# Patient Record
Sex: Female | Born: 1950 | Race: White | Hispanic: No | Marital: Married | Smoking: Never smoker
Health system: Southern US, Community
[De-identification: ages and names within clinical notes are randomized; demographics above are authoritative.]

## PROBLEM LIST (undated history)

## (undated) DIAGNOSIS — M1990 Unspecified osteoarthritis, unspecified site: Secondary | ICD-10-CM

## (undated) DIAGNOSIS — R51 Headache: Secondary | ICD-10-CM

## (undated) DIAGNOSIS — F419 Anxiety disorder, unspecified: Secondary | ICD-10-CM

## (undated) DIAGNOSIS — C801 Malignant (primary) neoplasm, unspecified: Secondary | ICD-10-CM

## (undated) DIAGNOSIS — Z889 Allergy status to unspecified drugs, medicaments and biological substances status: Secondary | ICD-10-CM

## (undated) DIAGNOSIS — E039 Hypothyroidism, unspecified: Secondary | ICD-10-CM

## (undated) DIAGNOSIS — R519 Headache, unspecified: Secondary | ICD-10-CM

## (undated) DIAGNOSIS — T4145XA Adverse effect of unspecified anesthetic, initial encounter: Secondary | ICD-10-CM

## (undated) HISTORY — PX: CARPAL TUNNEL RELEASE: SHX101

## (undated) HISTORY — PX: TONSILLECTOMY: SUR1361

## (undated) HISTORY — PX: OTHER SURGICAL HISTORY: SHX169

## (undated) HISTORY — PX: APPENDECTOMY: SHX54

## (undated) HISTORY — PX: ABDOMINAL HYSTERECTOMY: SHX81

## (undated) HISTORY — PX: CYSTOCELE REPAIR: SHX163

## (undated) HISTORY — PX: BREAST BIOPSY: SHX20

---

## 1998-08-02 ENCOUNTER — Encounter: Payer: Self-pay | Admitting: Oral Surgery

## 1998-08-02 ENCOUNTER — Ambulatory Visit (HOSPITAL_COMMUNITY): Admission: RE | Admit: 1998-08-02 | Discharge: 1998-08-02 | Payer: Self-pay | Admitting: Oral Surgery

## 1998-12-31 ENCOUNTER — Other Ambulatory Visit: Admission: RE | Admit: 1998-12-31 | Discharge: 1998-12-31 | Payer: Self-pay | Admitting: *Deleted

## 1999-01-11 ENCOUNTER — Encounter: Payer: Self-pay | Admitting: *Deleted

## 1999-01-11 ENCOUNTER — Encounter: Admission: RE | Admit: 1999-01-11 | Discharge: 1999-01-11 | Payer: Self-pay | Admitting: *Deleted

## 2000-02-05 ENCOUNTER — Other Ambulatory Visit: Admission: RE | Admit: 2000-02-05 | Discharge: 2000-02-05 | Payer: Self-pay | Admitting: *Deleted

## 2001-02-05 ENCOUNTER — Other Ambulatory Visit: Admission: RE | Admit: 2001-02-05 | Discharge: 2001-02-05 | Payer: Self-pay | Admitting: Obstetrics and Gynecology

## 2001-12-21 ENCOUNTER — Encounter: Payer: Self-pay | Admitting: Obstetrics and Gynecology

## 2001-12-21 ENCOUNTER — Encounter: Admission: RE | Admit: 2001-12-21 | Discharge: 2001-12-21 | Payer: Self-pay | Admitting: Obstetrics and Gynecology

## 2002-02-07 ENCOUNTER — Other Ambulatory Visit: Admission: RE | Admit: 2002-02-07 | Discharge: 2002-02-07 | Payer: Self-pay | Admitting: Obstetrics and Gynecology

## 2002-07-21 ENCOUNTER — Encounter: Admission: RE | Admit: 2002-07-21 | Discharge: 2002-07-21 | Payer: Self-pay | Admitting: Obstetrics and Gynecology

## 2002-07-21 ENCOUNTER — Encounter: Payer: Self-pay | Admitting: Obstetrics and Gynecology

## 2003-01-25 ENCOUNTER — Encounter: Admission: RE | Admit: 2003-01-25 | Discharge: 2003-01-25 | Payer: Self-pay | Admitting: Obstetrics and Gynecology

## 2003-03-03 ENCOUNTER — Other Ambulatory Visit: Admission: RE | Admit: 2003-03-03 | Discharge: 2003-03-03 | Payer: Self-pay | Admitting: Obstetrics and Gynecology

## 2003-03-18 HISTORY — PX: CHOLECYSTECTOMY: SHX55

## 2003-03-18 HISTORY — PX: OTHER SURGICAL HISTORY: SHX169

## 2003-04-28 ENCOUNTER — Ambulatory Visit (HOSPITAL_COMMUNITY): Admission: RE | Admit: 2003-04-28 | Discharge: 2003-04-29 | Payer: Self-pay | Admitting: General Surgery

## 2003-04-28 ENCOUNTER — Encounter (INDEPENDENT_AMBULATORY_CARE_PROVIDER_SITE_OTHER): Payer: Self-pay | Admitting: *Deleted

## 2003-11-30 ENCOUNTER — Other Ambulatory Visit: Admission: RE | Admit: 2003-11-30 | Discharge: 2003-11-30 | Payer: Self-pay | Admitting: Diagnostic Radiology

## 2004-08-14 ENCOUNTER — Ambulatory Visit (HOSPITAL_COMMUNITY): Admission: RE | Admit: 2004-08-14 | Discharge: 2004-08-14 | Payer: Self-pay | Admitting: Obstetrics and Gynecology

## 2006-09-30 ENCOUNTER — Encounter: Admission: RE | Admit: 2006-09-30 | Discharge: 2006-09-30 | Payer: Self-pay | Admitting: Obstetrics and Gynecology

## 2006-12-14 ENCOUNTER — Encounter: Admission: RE | Admit: 2006-12-14 | Discharge: 2006-12-14 | Payer: Self-pay | Admitting: Sports Medicine

## 2007-06-18 ENCOUNTER — Encounter: Admission: RE | Admit: 2007-06-18 | Discharge: 2007-06-18 | Payer: Self-pay | Admitting: Internal Medicine

## 2007-09-07 ENCOUNTER — Ambulatory Visit (HOSPITAL_BASED_OUTPATIENT_CLINIC_OR_DEPARTMENT_OTHER): Admission: RE | Admit: 2007-09-07 | Discharge: 2007-09-08 | Payer: Self-pay | Admitting: Urology

## 2009-03-06 ENCOUNTER — Encounter: Admission: RE | Admit: 2009-03-06 | Discharge: 2009-03-06 | Payer: Self-pay | Admitting: Obstetrics and Gynecology

## 2009-07-16 ENCOUNTER — Encounter: Admission: RE | Admit: 2009-07-16 | Discharge: 2009-07-16 | Payer: Self-pay | Admitting: Family Medicine

## 2010-04-08 ENCOUNTER — Encounter: Payer: Self-pay | Admitting: Sports Medicine

## 2010-07-30 NOTE — Op Note (Signed)
NAME:  Marie Maynard, Marie Maynard NO.:  [id number]   MEDICAL RECORD NO.:  [id number]          PATIENT TYPE:  AMB   LOCATION:  [facility]                         FACILITY:  [facility]   PHYSICIAN:  Jamison Neighbor, M.D.  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   PROCEDURES:  1. Anterior repair with mesh, including vault suspension.  2. Cystoscopy.   SURGEON:  Jamison Neighbor, M.D.   ANESTHESIA:  General.   COMPLICATIONS:  None.   DRAINS:  None.   BRIEF HISTORY:  This 60 year old female has had a past problem with  stress urinary incontinence, but that was taken care of with a  pubovaginal sling.  This has held up nicely, and the patient does not  appear to have any stress incontinence.  She has had some problems with  incomplete emptying secondary to a large cystocele.  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  She has been on antibiotic  prophylaxis.  When she does stick with that, she does well, but she has  now become symptomatic from her cystocele.  The patient does not have a  significant enterocele.  She has a little bit of laxity posteriorly, but  really not much in the way of a rectocele.  She would like to have the  anterior repair done, and we would like to reinforce that with mesh.  She has requested that a rectocele be done if necessary, but since she  is not having a lot of bowel problems at this point, it is unlikely that  will prove to be necessary.  The patient has agreed to an anterior  repair with mesh.  She does know that if she has significant evidence of  stress incontinence intraoperatively, we will reinforce her sling with  an additional midurethral sling, but right now we would like not to do  that, as we are encouraging  her emptying of the bladder.  The patient  understands the risks and benefits of the procedure, including  infection, erosion, or the possibility that she may have urinary  retention.  Full informed consent was obtained.   PROCEDURE:  After successful induction of general anesthesia, the  patient was placed in the dorsal lithotomy position, prepped with  Betadine, and draped in the usual sterile fashion.  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  There is a cystocele.  There is not much in the way of an  enterocele.  There is at most a modest rectocele.  The weighted vaginal  speculum was placed, and a L[place] Star retractor was used for exposure.  The anterior vaginal mucosa was infiltrated with local anesthetic.  An  incision was made directly over the cystocele, and flaps were raised  bilaterally.  Dissection proceeded back to and through the endopelvic  fascia, exposing the sacrospinalis on each side.  The Capio  device was  used to place the blue arm of the Pinnacle device into the sacrospinalis  on each side.  The Capio was then used to put the white arm of the mesh  graft into the white line.  The limbs were then brought up, and  appropriate tension was obtained, nicely elevating the cystocele.  The  mesh was tacked down to the vaginal cuff, and the additional posterior  aspect was trimmed off.  It was also tacked down up towards the bladder  neck.  The coverage was excellent, and it does appear that the repair  should hold her in a very neutral position.  She was evaluated with  cystoscopy twice, first when the limbs were first placed with 70 mm  instrument to ensure there was no injury to the bladder, then at the end  to make sure there was no overcorrection at the urethra.  The cystocele  did appear to reduce nicely.  The vault went up nicely.  The patient  really did not need any additional sling material, as she  had a  neutrally placed urethra.  With a full bladder and Crede there was nice  flow of urine, without any evidence of obstruction.  The area was  irrigated antibiotic solution.  The mesh was trimmed appropriately and  tacked down, as noted above.  The incision was closed with a running  suture of 2-0 Vicryl.  No vaginal mucosa was trimmed away.  Inspection  at that point showed that there really did not appear to be a  significant rectocele.  While minor laxity was noted, a rectal  examination confirmed that there was really not a significant defect in  the midline, and it was felt that there was no real need for a rectocele  repair.  Inspection showed that the rectum had not been compromised or  injured in any way by the mesh placement.  The gown and glove were  removed, and new gloves placed, and the area was irrigated one final  time with antibiotic solution, and packing was placed.  The patient had  a Foley catheter in place which will be left for 23-hour observation.  The patient will stay in the hospital for antibiotic coverage and pain  management.  She will have her Foley catheter and packing removed and  will be discharged if she voids normally.      Jamison Neighbor, M.D.  Electronically Signed     RJE/MEDQ  D:  09/07/2007  T:  09/07/2007  Job:  [id number]

## 2010-08-02 NOTE — Op Note (Signed)
NAME:  Marie Maynard, Marie Maynard                     ACCOUNT NO.:  [id number]   MEDICAL RECORD NO.:  [id number]                   PATIENT TYPE:  OIB   LOCATION:  2899                                 FACILITY:  [facility]   PHYSICIAN:  Gabrielle Dare. Janee Morn, M.D.             DATE OF BIRTH:  02-Jun-1950   DATE OF PROCEDURE:  04/28/2003  DATE OF DISCHARGE:                                 OPERATIVE REPORT   PREOPERATIVE DIAGNOSIS:  Symptomatic cholelithiasis.   POSTOPERATIVE DIAGNOSIS:  Symptomatic cholelithiasis.   PROCEDURE:  Laparoscopic cholecystectomy.   SURGEON:  Gabrielle Dare. Janee Morn, M.D.   ASSISTANT:  Abigail Miyamoto, M.D.   ANESTHESIA:  General.   HISTORY OF PRESENT ILLNESS:  The patient is a 60 year old white female who  is a patient of Dr. Marcelyn Bruins status post pelvic sling procedure in the  late 1990s.  She was having some abdominal pain.  Workup with Dr. Logan Bores  included an ultrasound looking for renal stones and she was noted to have  cholelithiasis.  She continues to have biliary colic type pain and she  presents now for an elective cholecystectomy.   PROCEDURE IN DETAIL:  Informed consent was obtained.  The patient received  intravenous  antibiotics.  She was taken to the operating room and general  anesthesia was administered.  Her abdomen was prepped and draped in a  sterile fashion.  A curvilinear infraumbilical incision was made.  Subcutaneous tissues were dissected down.  The anterior fascia was divided.  The peritoneal cavity was entered under direct vision.  There were some  filmy omental adhesions which were swept away.  A 0 Vicryl purse-string  suture was placed on the fascial opening.  The Hasson trocar was inserted  into the abdomen.  The abdomen was insufflated with carbon dioxide.  Under  direct vision an 11 mm epigastric and two 5 mm lateral ports were placed.  The dome of the gallbladder was retracted superomedially.  There were  several omental adhesions along the  dome and the body of the gallbladder.  These were swept down without difficulty.  Further adhesions were stuck  around the infundibulum area and these were also cleared off bluntly.  Once  this was accomplished, the infundibulum was retracted inferolaterally.  Dissection began laterally and progressed medially.  The cystic duct was  identified which was quite narrow.  Dissection was continued until a large  window was formed between the infundibulum, cystic duct, and the liver.  This required freeing  of the peritoneal attachments of the medial and  lateral gallbladder wall.  Excellent visualization was obtained.  A clip was  placed on the infundibulum cystic duct junction.  A small nick was made in  the cystic duct.  There was also a large branch of the cystic artery that  ran along the back side of the cystic duct.  This prohibited access for a  cholangiogram that was planned.  This was abandoned due  to inability to  insert the Reddick catheter and the need to get hemostasis of the small  branch.  Three clips were placed proximally on the cystic duct and it was  divided.  Subsequently, two clips were placed on the main portion of the  cystic artery, one clip distally, and this was divided, as well.  The  gallbladder was taken off the liver bed with Bovie cautery.  It was placed  in an endocatch bag and removed from the abdomen via the infraumbilical port  site.  The clips and gallbladder bed were reinspected.  A few scattered  areas on the liver bed were cauterized to get excellent hemostasis.  The  clips remained in good position without any extravasation of blood or bile  from that area.  The abdomen was copiously irrigated.  A total of 2 liters  of saline were used.  This was evacuated and noted to be clear.  The  gallbladder bed was reinspected and remained hemostatic.  The clips remained  in good position.  The ports were then removed under direct vision.  The  pneumoperitoneum was  released.  The Hasson trocar was removed.  The  umbilical port site was closed by tying the 0 Vicryl purse-string suture  that was in the fascia with care not to trap any intra-abdominal contents.  Once that was accomplished, all wounds were copiously irrigated.  0.25%  Marcaine with epinephrine had been used in each one for postoperative pain  relief.  The skin of each was closed with a running 4-0 Vicryl subcuticular  stitch.  Sponge, needle, and instrument counts were correct.  Benzoin, Steri-  Strips, and sterile dressings were applied.  The patient tolerated the  procedure well without apparent complications.  She was taken to the  recovery room in stable condition.                                               Gabrielle Dare Janee Morn, M.D.    BET/MEDQ  D:  04/28/2003  T:  04/28/2003  Job:  [id number]

## 2010-10-25 ENCOUNTER — Other Ambulatory Visit: Payer: Self-pay | Admitting: Obstetrics and Gynecology

## 2010-10-25 DIAGNOSIS — Z1231 Encounter for screening mammogram for malignant neoplasm of breast: Secondary | ICD-10-CM

## 2010-10-30 ENCOUNTER — Ambulatory Visit
Admission: RE | Admit: 2010-10-30 | Discharge: 2010-10-30 | Disposition: A | Discharge status: Home / Self Care | Payer: BC Managed Care – PPO | Source: Ambulatory Visit | Attending: Obstetrics and Gynecology | Admitting: Obstetrics and Gynecology

## 2010-10-30 DIAGNOSIS — Z1231 Encounter for screening mammogram for malignant neoplasm of breast: Secondary | ICD-10-CM

## 2010-12-12 LAB — POCT HEMOGLOBIN-HEMACUE: Operator id: 268271

## 2011-11-18 ENCOUNTER — Other Ambulatory Visit: Payer: Self-pay | Admitting: Obstetrics and Gynecology

## 2011-11-18 DIAGNOSIS — Z1231 Encounter for screening mammogram for malignant neoplasm of breast: Secondary | ICD-10-CM

## 2011-11-18 DIAGNOSIS — Z780 Asymptomatic menopausal state: Secondary | ICD-10-CM

## 2011-11-25 ENCOUNTER — Ambulatory Visit: Payer: BC Managed Care – PPO

## 2011-11-25 ENCOUNTER — Other Ambulatory Visit: Payer: BC Managed Care – PPO

## 2011-12-04 ENCOUNTER — Ambulatory Visit
Admission: RE | Admit: 2011-12-04 | Discharge: 2011-12-04 | Disposition: A | Discharge status: Home / Self Care | Payer: BC Managed Care – PPO | Source: Ambulatory Visit | Attending: Obstetrics and Gynecology | Admitting: Obstetrics and Gynecology

## 2011-12-04 DIAGNOSIS — Z1231 Encounter for screening mammogram for malignant neoplasm of breast: Secondary | ICD-10-CM

## 2011-12-04 DIAGNOSIS — Z780 Asymptomatic menopausal state: Secondary | ICD-10-CM

## 2012-01-19 ENCOUNTER — Other Ambulatory Visit: Payer: Self-pay | Admitting: Family Medicine

## 2012-01-19 DIAGNOSIS — E041 Nontoxic single thyroid nodule: Secondary | ICD-10-CM

## 2012-01-23 ENCOUNTER — Ambulatory Visit
Admission: RE | Admit: 2012-01-23 | Discharge: 2012-01-23 | Disposition: A | Discharge status: Home / Self Care | Payer: BC Managed Care – PPO | Source: Ambulatory Visit | Attending: Family Medicine | Admitting: Family Medicine

## 2012-01-23 DIAGNOSIS — E041 Nontoxic single thyroid nodule: Secondary | ICD-10-CM

## 2012-12-27 ENCOUNTER — Other Ambulatory Visit: Payer: Self-pay

## 2012-12-27 DIAGNOSIS — Z1231 Encounter for screening mammogram for malignant neoplasm of breast: Secondary | ICD-10-CM

## 2013-01-11 ENCOUNTER — Ambulatory Visit
Admission: RE | Admit: 2013-01-11 | Discharge: 2013-01-11 | Disposition: A | Discharge status: Home / Self Care | Payer: BC Managed Care – PPO | Source: Ambulatory Visit

## 2013-01-11 DIAGNOSIS — Z1231 Encounter for screening mammogram for malignant neoplasm of breast: Secondary | ICD-10-CM

## 2014-02-08 ENCOUNTER — Other Ambulatory Visit: Payer: Self-pay

## 2014-02-08 DIAGNOSIS — Z1231 Encounter for screening mammogram for malignant neoplasm of breast: Secondary | ICD-10-CM

## 2014-02-13 ENCOUNTER — Ambulatory Visit
Admission: RE | Admit: 2014-02-13 | Discharge: 2014-02-13 | Disposition: A | Discharge status: Home / Self Care | Payer: BC Managed Care – PPO | Source: Ambulatory Visit

## 2014-02-13 DIAGNOSIS — Z1231 Encounter for screening mammogram for malignant neoplasm of breast: Secondary | ICD-10-CM

## 2014-03-03 ENCOUNTER — Observation Stay (HOSPITAL_BASED_OUTPATIENT_CLINIC_OR_DEPARTMENT_OTHER)
Admission: EM | Admit: 2014-03-03 | Discharge: 2014-03-05 | Disposition: A | Discharge status: Home / Self Care | Payer: BC Managed Care – PPO | Attending: Internal Medicine | Admitting: Internal Medicine

## 2014-03-03 ENCOUNTER — Encounter (HOSPITAL_BASED_OUTPATIENT_CLINIC_OR_DEPARTMENT_OTHER): Payer: Self-pay | Admitting: *Deleted

## 2014-03-03 ENCOUNTER — Emergency Department (HOSPITAL_BASED_OUTPATIENT_CLINIC_OR_DEPARTMENT_OTHER): Payer: BC Managed Care – PPO

## 2014-03-03 DIAGNOSIS — E876 Hypokalemia: Secondary | ICD-10-CM | POA: Diagnosis present

## 2014-03-03 DIAGNOSIS — R42 Dizziness and giddiness: Secondary | ICD-10-CM | POA: Diagnosis not present

## 2014-03-03 DIAGNOSIS — R079 Chest pain, unspecified: Secondary | ICD-10-CM | POA: Diagnosis present

## 2014-03-03 DIAGNOSIS — Q211 Atrial septal defect: Secondary | ICD-10-CM | POA: Insufficient documentation

## 2014-03-03 DIAGNOSIS — R0602 Shortness of breath: Secondary | ICD-10-CM | POA: Diagnosis not present

## 2014-03-03 DIAGNOSIS — Q2112 Patent foramen ovale: Secondary | ICD-10-CM | POA: Insufficient documentation

## 2014-03-03 HISTORY — DX: Hypothyroidism, unspecified: E03.9

## 2014-03-03 LAB — CBC
HEMATOCRIT: 38 % (ref 36.0–46.0)
HEMOGLOBIN: 12.7 g/dL (ref 12.0–15.0)
MCH: 29.7 pg (ref 26.0–34.0)
MCHC: 33.4 g/dL (ref 30.0–36.0)
MCV: 89 fL (ref 78.0–100.0)
Platelets: 191 10*3/uL (ref 150–400)
RBC: 4.27 MIL/uL (ref 3.87–5.11)
RDW: 13.4 % (ref 11.5–15.5)
WBC: 5 10*3/uL (ref 4.0–10.5)

## 2014-03-03 LAB — BASIC METABOLIC PANEL
ANION GAP: 12 (ref 5–15)
BUN: 15 mg/dL (ref 6–23)
CHLORIDE: 103 meq/L (ref 96–112)
CO2: 30 meq/L (ref 19–32)
Calcium: 9.3 mg/dL (ref 8.4–10.5)
Creatinine, Ser: 0.8 mg/dL (ref 0.50–1.10)
GFR calc Af Amer: 89 mL/min — ABNORMAL LOW (ref >90)
GFR calc non Af Amer: 77 mL/min — ABNORMAL LOW (ref >90)
GLUCOSE: 107 mg/dL — AB (ref 70–99)
POTASSIUM: 3.5 meq/L — AB (ref 3.7–5.3)
SODIUM: 145 meq/L (ref 137–147)

## 2014-03-03 LAB — TROPONIN I: Troponin I: <0.3 ng/mL (ref <0.30)

## 2014-03-03 MED ORDER — ENOXAPARIN SODIUM 40 MG/0.4ML ~~LOC~~ SOLN
40.0000 mg | SUBCUTANEOUS | Status: DC
  Filled 2014-03-03 (×2): qty 0.4

## 2014-03-03 MED ORDER — SERTRALINE HCL 100 MG PO TABS
100.0000 mg | ORAL_TABLET | Freq: Every day | ORAL | Status: DC
  Administered 2014-03-04 – 2014-03-05 (×2): 100 mg via ORAL
  Filled 2014-03-03 (×2): qty 1

## 2014-03-03 MED ORDER — ONDANSETRON HCL 4 MG/2ML IJ SOLN
4.0000 mg | Freq: Once | INTRAMUSCULAR | Status: AC
  Administered 2014-03-03: 4 mg via INTRAVENOUS
  Filled 2014-03-03: qty 2

## 2014-03-03 MED ORDER — LEVOTHYROXINE SODIUM 50 MCG PO TABS
50.0000 ug | ORAL_TABLET | Freq: Every day | ORAL | Status: DC
  Administered 2014-03-04 – 2014-03-05 (×2): 50 ug via ORAL
  Filled 2014-03-03 (×2): qty 1

## 2014-03-03 MED ORDER — NITROGLYCERIN 0.4 MG SL SUBL
0.4000 mg | SUBLINGUAL_TABLET | SUBLINGUAL | Status: DC | PRN
  Administered 2014-03-03: 0.4 mg via SUBLINGUAL
  Filled 2014-03-03: qty 1

## 2014-03-03 MED ORDER — MORPHINE SULFATE 2 MG/ML IJ SOLN
2.0000 mg | Freq: Once | INTRAMUSCULAR | Status: AC
  Administered 2014-03-03: 2 mg via INTRAVENOUS
  Filled 2014-03-03: qty 1

## 2014-03-03 MED ORDER — ACETAMINOPHEN 325 MG PO TABS
650.0000 mg | ORAL_TABLET | ORAL | Status: DC | PRN
  Administered 2014-03-04: 650 mg via ORAL
  Filled 2014-03-03: qty 2

## 2014-03-03 MED ORDER — POTASSIUM CHLORIDE CRYS ER 20 MEQ PO TBCR
40.0000 meq | EXTENDED_RELEASE_TABLET | Freq: Once | ORAL | Status: AC
  Administered 2014-03-04: 40 meq via ORAL
  Filled 2014-03-03: qty 2

## 2014-03-03 MED ORDER — ALPRAZOLAM 0.25 MG PO TABS: 0.2500 mg | ORAL_TABLET | Freq: Two times a day (BID) | ORAL | Status: DC | PRN

## 2014-03-03 MED ORDER — MORPHINE SULFATE 2 MG/ML IJ SOLN: 2.0000 mg | INTRAMUSCULAR | Status: DC | PRN

## 2014-03-03 MED ORDER — ONDANSETRON HCL 4 MG/2ML IJ SOLN: 4.0000 mg | Freq: Four times a day (QID) | INTRAMUSCULAR | Status: DC | PRN

## 2014-03-03 NOTE — H&P (Signed)
PCP:  Vidal Schwalbe, MD    Chief Complaint:  Chest pain  HPI: Marie Maynard is a 63 y.o. female   has a past medical history of Hypothyroidism.   Presented with  Patient was shopping started to have chest pain worse with deep breathing and felt lightheaded. The pain felt between the ribs and worse with pressing on the chest.  Patient denies any leg swelling,  no recent trips. Patient has very sedentary job. Currently she is chest pain free. Patient has had a lot of stress recently due to illness of her daughter. While she was having the episode she was dealing with a stressful events as well regarding her shopping. 911 was calleld but patient refused  Transport she arrived by private vehical to [facility].   Hospitalist was called for admission for Chest pain  Review of Systems:    Pertinent positives include:  chest pain,  Dizziness, shortness of breath at rest.   Constitutional:  No weight loss, night sweats, Fevers, chills, fatigue, weight loss  HEENT:  No headaches, Difficulty swallowing,Tooth/dental problems,Sore throat,  No sneezing, itching, ear ache, nasal congestion, post nasal drip,  Cardio-vascular:  No , Orthopnea, PND, anasarca,, palpitations.no Bilateral lower extremity swelling  GI:  No heartburn, indigestion, abdominal pain, nausea, vomiting, diarrhea, change in bowel habits, loss of appetite, melena, blood in stool, hematemesis Resp:  no No dyspnea on exertion, No excess mucus, no productive cough, No non-productive cough, No coughing up of blood.No change in color of mucus.No wheezing. Skin:  no rash or lesions. No jaundice GU:  no dysuria, change in color of urine, no urgency or frequency. No straining to urinate.  No flank pain.  Musculoskeletal:  No joint pain or no joint swelling. No decreased range of motion. No back pain.  Psych:  No change in mood or affect. No depression or anxiety. No memory loss.  Neuro: no localizing neurological complaints,  no tingling, no weakness, no double vision, no gait abnormality, no slurred speech, no confusion  Otherwise ROS are negative except for above, 10 systems were reviewed  Past Medical History: Past Medical History  Diagnosis Date  . Hypothyroidism    History reviewed. No pertinent past surgical history.   Medications: Prior to Admission medications   Medication Sig Start Date End Date Taking? Authorizing Provider  levothyroxine (SYNTHROID, LEVOTHROID) 50 MCG tablet Take 50 mcg by mouth daily before breakfast.   Yes Historical Provider, MD  sertraline (ZOLOFT) 100 MG tablet Take 100 mg by mouth daily.   Yes Historical Provider, MD    Allergies:   Allergies  Allergen Reactions  . Codeine Itching  . Neurontin [Gabapentin] Other (See Comments)    Seeing double  . Oxycodone Itching  . Prednisone Anxiety  . Levaquin [Levofloxacin] Other (See Comments)    Feeling tired    Social History:  Ambulatory  independently    Lives at home  With family     reports that she has never smoked. She does not have any smokeless tobacco history on file. She reports that she does not drink alcohol or use illicit drugs.    Family History: family history includes COPD in her mother; Congenital heart disease in her brother; Pancreatic cancer in her father; Pulmonary Hypertension in her sister.    Physical Exam: Patient Vitals for the past 24 hrs:  BP Temp Temp src Pulse Resp SpO2 Height Weight  03/03/14 2256 (!) 94/43 mmHg 97.9 F (36.6 C) Oral 67 - 95 % 5'  5" (1.651 m) 69.582 kg (153 lb 6.4 oz)  03/03/14 2130 101/58 mmHg - - 60 15 96 % - -  03/03/14 2100 97/59 mmHg - - 60 13 98 % - -  03/03/14 2030 100/60 mmHg - - 63 14 96 % - -  03/03/14 2000 143/92 mmHg - - 67 17 100 % - -  03/03/14 1930 110/58 mmHg - - 69 20 96 % - -  03/03/14 1905 114/60 mmHg - - - 16 100 % - -  03/03/14 1900 108/61 mmHg - - 67 15 100 % - -  03/03/14 1800 101/56 mmHg - - 68 16 92 % - -  03/03/14 1742 - - - 65 17 95  % - -  03/03/14 1737 106/58 mmHg - - 62 18 98 % - -  03/03/14 1736 106/58 mmHg - - 66 19 97 % - -  03/03/14 1730 - - - 69 18 98 % - -  03/03/14 1724 - - - 70 19 96 % - -  03/03/14 1718 - - - 66 25 99 % - -  03/03/14 1712 - - - 73 20 98 % - -  03/03/14 1706 - - - 72 15 100 % - -  03/03/14 1700 - - - 70 13 99 % - -  03/03/14 1654 - - - 67 17 98 % - -  03/03/14 1648 - - - 66 12 97 % - -  03/03/14 1636 - - - 72 21 97 % - -  03/03/14 1630 - - - 73 19 99 % - -  03/03/14 1611 136/78 mmHg 97.9 F (36.6 C) - 73 18 99 % 5\' 5"  (1.651 m) 63.504 kg (140 lb)    1. General:  in No Acute distress 2. Psychological: Alert and Oriented 3. Head/ENT:   Moist   Mucous Membranes                          Head Non traumatic, neck supple                          Normal  Dentition 4. SKIN:  decreased Skin turgor,  Skin clean Dry and intact no rash 5. Heart: Regular rate and rhythm no Murmur, Rub or gallop 6. Lungs: Clear to auscultation bilaterally, no wheezes or crackles   7. Abdomen: Soft, non-tender, Non distended 8. Lower extremities: no clubbing, cyanosis, or edema 9. Neurologically Grossly intact, moving all 4 extremities equally Difficult to palpate pulses on the dorsum of the feet but normal posterior tibialis pulses with feet warm with good capilary refill  10. MSK: Normal range of motion, pain reproducible by plapation  body mass index is 25.53 kg/(m^2).   Labs on Admission:   Results for orders placed or performed during the hospital encounter of 03/03/14 (from the past 24 hour(s))  Basic metabolic panel     Status: Abnormal   Collection Time: 03/03/14  5:00 PM  Result Value Ref Range   Sodium 145 137 - 147 mEq/L   Potassium 3.5 (L) 3.7 - 5.3 mEq/L   Chloride 103 96 - 112 mEq/L   CO2 30 19 - 32 mEq/L   Glucose, Bld 107 (H) 70 - 99 mg/dL   BUN 15 6 - 23 mg/dL   Creatinine, Ser 0.80 0.50 - 1.10 mg/dL   Calcium 9.3 8.4 - 10.5 mg/dL   GFR calc non Af Amer 77 (L) >  90 mL/min   GFR calc Af  Amer 89 (L) >90 mL/min   Anion gap 12 5 - 15  CBC     Status: None   Collection Time: 03/03/14  5:00 PM  Result Value Ref Range   WBC 5.0 4.0 - 10.5 K/uL   RBC 4.27 3.87 - 5.11 MIL/uL   Hemoglobin 12.7 12.0 - 15.0 g/dL   HCT 38.0 36.0 - 46.0 %   MCV 89.0 78.0 - 100.0 fL   MCH 29.7 26.0 - 34.0 pg   MCHC 33.4 30.0 - 36.0 g/dL   RDW 13.4 11.5 - 15.5 %   Platelets 191 150 - 400 K/uL  Troponin I     Status: None   Collection Time: 03/03/14  5:00 PM  Result Value Ref Range   Troponin I <0.30 <0.30 ng/mL    UA not obtained  No results found for: HGBA1C  Estimated Creatinine Clearance: 70.4 mL/min (by C-G formula based on Cr of 0.8).  BNP (last 3 results) No results for input(s): PROBNP in the last 8760 hours.  Other results:  I have pearsonaly reviewed this: ECG REPORT  Rate: 74  Rhythm: NSR ST&T Change: no ischemic changes  Filed Weights   03/03/14 1611 03/03/14 2256  Weight: 63.504 kg (140 lb) 69.582 kg (153 lb 6.4 oz)     Cultures: No results found for: SDES, SPECREQUEST, CULT, REPTSTATUS   Radiological Exams on Admission: Dg Chest 2 View  03/03/2014   CLINICAL DATA:  Left-sided chest pain and near syncope for 1 day  EXAM: CHEST  2 VIEW  COMPARISON:  None.  FINDINGS: Lungs are clear. Heart size and pulmonary vascularity are normal. No adenopathy. There is degenerative change in the thoracic spine. No pneumothorax.  IMPRESSION: No edema or consolidation.   Electronically Signed   By: Lowella Grip M.D.   On: 03/03/2014 16:58    Chart has been reviewed  Assessment/Plan  63 yo F with hx of hypothyrodism and anxiety  her with atypical chest pain worse with inspiration and palpation Present on Admission:  . Chest pain -  will admit, monitor on telemetry, cycle cardiac enzymes, obtain serial ECG. Further risk stratify with lipid panel, hgA1C, obtain TSH. Make sure patient is on Aspirin. Further treatment based on the currently pending results.  Given pleuretic  component will check d.dimer . Hypokalemia - replace  hypothyrodism - continue synthroid,    Prophylaxis: Lovenox, Protonix  CODE STATUS:  FULL CODE   Other plan as per orders.  I have spent a total of 55 min on this admission  Jeanmarie Mccowen 03/03/2014, 11:24 PM  [facility]  Pager [phone]   after 2 AM please page floor coverage PA If 7AM-7PM, please contact the day team taking care of the patient  [email or web address]  Password [email or web address]

## 2014-03-03 NOTE — ED Notes (Signed)
MD at bedside. 

## 2014-03-03 NOTE — ED Notes (Signed)
Chest pain this am. EMS was called and did an EKG that was normal. She refused transport at that time. Pain in her left chest and left shoulder has not gone away. She is also having pain in both of her legs for several weeks.

## 2014-03-03 NOTE — ED Provider Notes (Signed)
CSN: [id number]     Arrival date & time 03/03/14  1608 History   First MD Initiated Contact with Patient 03/03/14 1618     Chief Complaint  Patient presents with  . Chest Pain     (Consider location/radiation/quality/duration/timing/severity/associated sxs/prior Treatment) HPI Comments: PT with hx of diet controlled hyperlipidemia presents with chest pain. She states it started about 12:00 this afternoon while she was shopping. She describes as an achy pain in the left chest. It radiated to her left arm. She had associated shortness of breath and she felt lightheaded like she might pass out with the discomfort. It's eased off quite a bit now but she still has a little bit of discomfort in the ED. It's been constant since it first started. She denies any history of chest pain. She denies any cough or congestion. She denies any tobacco use. There is no family history of early heart disease.  Patient is a 63 y.o. female presenting with chest pain.  Chest Pain Associated symptoms: shortness of breath   Associated symptoms: no abdominal pain, no back pain, no cough, no diaphoresis, no dizziness, no fatigue, no fever, no headache, no nausea, no numbness, not vomiting and no weakness     History reviewed. No pertinent past medical history. History reviewed. No pertinent past surgical history. No family history on file. History  Substance Use Topics  . Smoking status: Never Smoker   . Smokeless tobacco: Not on file  . Alcohol Use: No   OB History    No data available     Review of Systems  Constitutional: Negative for fever, chills, diaphoresis and fatigue.  HENT: Negative for congestion, rhinorrhea and sneezing.   Eyes: Negative.   Respiratory: Positive for shortness of breath. Negative for cough and chest tightness.   Cardiovascular: Positive for chest pain. Negative for leg swelling.  Gastrointestinal: Negative for nausea, vomiting, abdominal pain, diarrhea and blood in stool.   Genitourinary: Negative for frequency, hematuria, flank pain and difficulty urinating.  Musculoskeletal: Negative for back pain and arthralgias.  Skin: Negative for rash.  Neurological: Positive for light-headedness. Negative for dizziness, speech difficulty, weakness, numbness and headaches.      Allergies  Review of patient's allergies indicates not on file.  Home Medications   Prior to Admission medications   Not on File   BP 114/60 mmHg  Pulse 65  Temp(Src) 97.9 F (36.6 C)  Resp 16  Ht 5\' 5"  (1.651 m)  Wt 140 lb (63.504 kg)  BMI 23.30 kg/m2  SpO2 100% Physical Exam  Constitutional: She is oriented to person, place, and time. She appears well-developed and well-nourished.  HENT:  Head: Normocephalic and atraumatic.  Eyes: Pupils are equal, round, and reactive to light.  Neck: Normal range of motion. Neck supple.  Cardiovascular: Normal rate, regular rhythm and normal heart sounds.   Pulmonary/Chest: Effort normal and breath sounds normal. No respiratory distress. She has no wheezes. She has no rales. She exhibits no tenderness.  Abdominal: Soft. Bowel sounds are normal. There is no tenderness. There is no rebound and no guarding.  Musculoskeletal: Normal range of motion. She exhibits no edema.  Lymphadenopathy:    She has no cervical adenopathy.  Neurological: She is alert and oriented to person, place, and time.  Skin: Skin is warm and dry. No rash noted.  Psychiatric: She has a normal mood and affect.    ED Course  Procedures (including critical care time) Labs Review Labs Reviewed  BASIC METABOLIC PANEL -  Abnormal; Notable for the following:    Potassium 3.5 (*)    Glucose, Bld 107 (*)    GFR calc non Af Amer 77 (*)    GFR calc Af Amer 89 (*)    All other components within normal limits  CBC  TROPONIN I    Imaging Review Dg Chest 2 View  03/03/2014   CLINICAL DATA:  Left-sided chest pain and near syncope for 1 day  EXAM: CHEST  2 VIEW  COMPARISON:   None.  FINDINGS: Lungs are clear. Heart size and pulmonary vascularity are normal. No adenopathy. There is degenerative change in the thoracic spine. No pneumothorax.  IMPRESSION: No edema or consolidation.   Electronically Signed   By: Lowella Grip M.D.   On: 03/03/2014 16:58     EKG Interpretation   Date/Time:  Friday March 03 2014 16:23:48 EST Ventricular Rate:  74 PR Interval:  156 QRS Duration: 90 QT Interval:  382 QTC Calculation: 424 R Axis:   75 Text Interpretation:  Normal sinus rhythm Normal ECG since last tracing no  significant change Confirmed by Giavanni Zeitlin  MD, Finlay Godbee ([id number]) on 03/03/2014  4:29:52 PM      MDM   Final diagnoses:  Chest pain, unspecified chest pain type    Patient presents with chest pain. Her EKG does not show ischemic changes. Her troponin is negative. She has a heart score 4. This is moderate risk and I discussed the admitted for further cardiac evaluation. Patient does want to go ahead with this. I will talk to the hospitalist regarding transfer to [facility] for further cardiac evaluation.  Spoke with Dr. Arnoldo Morale who will admit pt.  Malvin Johns, MD 03/03/14 [phone]

## 2014-03-03 NOTE — ED Notes (Signed)
Pt c/o abd pain  Given zofran  md notified

## 2014-03-03 NOTE — ED Notes (Signed)
Assisted on to bedpan.

## 2014-03-04 DIAGNOSIS — R079 Chest pain, unspecified: Secondary | ICD-10-CM | POA: Diagnosis not present

## 2014-03-04 DIAGNOSIS — R42 Dizziness and giddiness: Secondary | ICD-10-CM | POA: Diagnosis not present

## 2014-03-04 DIAGNOSIS — R072 Precordial pain: Secondary | ICD-10-CM

## 2014-03-04 DIAGNOSIS — R0789 Other chest pain: Secondary | ICD-10-CM

## 2014-03-04 DIAGNOSIS — R0602 Shortness of breath: Secondary | ICD-10-CM | POA: Diagnosis not present

## 2014-03-04 LAB — HEMOGLOBIN A1C
HEMOGLOBIN A1C: 5.4 % (ref <5.7)
Mean Plasma Glucose: 108 mg/dL (ref <117)

## 2014-03-04 LAB — D-DIMER, QUANTITATIVE: D-Dimer, Quant: 0.28 ug/mL-FEU (ref 0.00–0.48)

## 2014-03-04 LAB — TROPONIN I: Troponin I: <0.3 ng/mL (ref <0.30)

## 2014-03-04 NOTE — Consult Note (Signed)
CARDIOLOGY CONSULT NOTE   Patient ID: Marie Maynard MRN: [id number], DOB/AGE: Jun 01, 1950   Admit date: 03/03/2014 Date of Consult: 03/04/2014   Primary Physician: Vidal Schwalbe, MD Primary Cardiologist: None  Pt. Profile  63 year old woman without prior history of heart problems admitted with chest pain.  Problem List  Past Medical History  Diagnosis Date  . Hypothyroidism     History reviewed. No pertinent past surgical history.   Allergies  Allergies  Allergen Reactions  . Codeine Itching  . Neurontin [Gabapentin] Other (See Comments)    Seeing double  . Oxycodone Itching  . Prednisone Anxiety  . Levaquin [Levofloxacin] Other (See Comments)    Feeling tired    HPI   This 63 year old woman was admitted for chest pain.  She does not have any prior history of known heart disease.  She has been feeling more stressed during this holiday season.  She still works full-time in [employer].  She has been working especially busy these past 2 months.  Yesterday she was doing some holiday shopping.  At the checkout counter she felt some left-sided chest discomfort worse with taking a deep breath.  She felt a little lightheaded. She contacted her 50 office who suggested that she be checked at the hospital.  This morning she is having no discomfort and feels well.  Her chest x-ray is normal.  Her d-dimer is normal.  Her cardiac enzymes are normal.  Her EKG is normal. The patient does not have any history of diabetes.  She is a nonsmoker.  Her cholesterol levels have run slightly high.  She has a history of hypothyroidism.  Inpatient Medications  . enoxaparin (LOVENOX) injection  40 mg Subcutaneous Q24H  . levothyroxine  50 mcg Oral QAC breakfast  . sertraline  100 mg Oral Daily    Family History Family History  Problem Relation Age of Onset  . COPD Mother   . Pancreatic cancer Father   . Pulmonary Hypertension Sister   . Congenital heart  disease Brother      Social History History   Social History  . Marital Status: Married    Spouse Name: N/A    Number of Children: N/A  . Years of Education: N/A   Occupational History  . Not on file.   Social History Main Topics  . Smoking status: Never Smoker   . Smokeless tobacco: Not on file  . Alcohol Use: No  . Drug Use: No  . Sexual Activity: Not on file   Other Topics Concern  . Not on file   Social History Narrative  . No narrative on file     Review of Systems  General:  No chills, fever, night sweats or weight changes.  Cardiovascular:  No chest pain, dyspnea on exertion, edema, orthopnea, palpitations, paroxysmal nocturnal dyspnea. Dermatological: No rash, lesions/masses Respiratory: No cough, dyspnea Urologic: No hematuria, dysuria Abdominal:   No nausea, vomiting, diarrhea, bright red blood per rectum, melena, or hematemesis Neurologic:  No visual changes, wkns, changes in mental status. All other systems reviewed and are otherwise negative except as noted above.  Physical Exam  Blood pressure 88/52, pulse 60, temperature 97.9 F (36.6 C), temperature source Oral, resp. rate 18, height 5\' 5"  (1.651 m), weight 153 lb 3.1 oz (69.488 kg), SpO2 96 %.  General: Pleasant, NAD Psych: Normal affect. Neuro: Alert and oriented X 3. Moves all extremities spontaneously. HEENT: Normal  Neck: Supple without bruits or JVD. Lungs:  Resp regular and  unlabored, CTA. Heart: RRR no s3, s4, or murmurs. Abdomen: Soft, non-tender, non-distended, BS + x 4.  Extremities: No clubbing, cyanosis or edema. DP/PT/Radials 2+ and equal bilaterally.  Labs   Recent Labs  03/03/14 1700 03/04/14 0250 03/04/14 0547  TROPONINI <0.30 <0.30 <0.30   Lab Results  Component Value Date   WBC 5.0 03/03/2014   HGB 12.7 03/03/2014   HCT 38.0 03/03/2014   MCV 89.0 03/03/2014   PLT 191 03/03/2014     Recent Labs Lab 03/03/14 1700  NA 145  K 3.5*  CL 103  CO2 30  BUN 15    CREATININE 0.80  CALCIUM 9.3  GLUCOSE 107*   No results found for: CHOL, HDL, LDLCALC, TRIG Lab Results  Component Value Date   DDIMER 0.28 03/04/2014    Radiology/Studies  Dg Chest 2 View  03/03/2014   CLINICAL DATA:  Left-sided chest pain and near syncope for 1 day  EXAM: CHEST  2 VIEW  COMPARISON:  None.  FINDINGS: Lungs are clear. Heart size and pulmonary vascularity are normal. No adenopathy. There is degenerative change in the thoracic spine. No pneumothorax.  IMPRESSION: No edema or consolidation.   Electronically Signed   By: Lowella Grip M.D.   On: 03/03/2014 16:58   Mm Digital Screening Bilateral  02/14/2014   CLINICAL DATA:  Screening.  EXAM: DIGITAL SCREENING BILATERAL MAMMOGRAM WITH CAD  COMPARISON:  Previous exam(s).  ACR Breast Density Category b: There are scattered areas of fibroglandular density.  FINDINGS: There are no findings suspicious for malignancy. Images were processed with CAD.  IMPRESSION: No mammographic evidence of malignancy. A result letter of this screening mammogram will be mailed directly to the patient.  RECOMMENDATION: Screening mammogram in one year. (Code:SM-B-01Y)  BI-RADS CATEGORY  1: Negative.   Electronically Signed   By: Marin Olp M.D.   On: 02/14/2014 12:44    ECG  Normal sinus rhythm.  Within normal limits.  Personally reviewed.  ASSESSMENT AND PLAN  1.  Atypical chest pain.  No evidence of myocardial ischemia.  EKG and cardiac enzymes are normal.  I suspect that this may have been secondary to stress. 2.  Borderline hypokalemia 3.  Hypothyroidism  Recommendation: A two-dimensional echocardiogram is pending.  If the echo is unremarkable she should be able to be discharged later today.  Follow-up with Dr. Harlan Stains.  If she has further chest discomfort problems we would be glad to see her in the office for outpatient workup and stress test etc.    Signed, Darlin Coco, MD  03/04/2014, 9:22 AM

## 2014-03-04 NOTE — Discharge Summary (Addendum)
Physician Discharge Summary  Marie Maynard IWL:[id number] DOB: 01-01-1951 DOA: 03/03/2014  PCP: Vidal Schwalbe, MD  Admit date: 03/03/2014 Discharge date: 03/04/2014  Time spent: 50 minutes  Recommendations for Outpatient Follow-up:  1. F/u with Dr Mare Ferrari if chest pain recurs   Discharge Condition: stable Diet recommendation: heart healthy  Discharge Diagnoses:  Principal Problem:   Chest pain Active Problems:   Hypokalemia  hypothyroidism  History of present illness:  Marie Maynard is a 63 y.o. female who has a past medical history of Hypothyroidism.  Patient was shopping started to have chest pain worse with deep breathing and felt lightheaded. The pain was felt between the ribs and worse with pressing on the chest.  Patient denies any leg swelling, no recent trips. Patient has very sedentary job. Currently she is chest pain free. Patient has had a lot of stress recently due to illness of her daughter. While she was having the episode she was dealing with a stressful events as well regarding her shopping. 911 was calleld but patient refused Transport she arrived by private vehical to [facility].   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 - Dr Irish Lack and I have discussed the above finding of PFO- no further work up recommended- complications of CVA from DVT explained- TEDS ordered  Hypokalemia -Replaced  Hypothyroidism -Continue levothyroxine  Procedures:  2-D echo  Consultations:  Cardiology  Discharge Exam: Filed Weights   03/03/14 1611 03/03/14 2256 03/04/14 0444  Weight: 63.504 kg (140 lb) 69.582 kg (153 lb 6.4 oz) 69.488 kg (153 lb 3.1 oz)   Filed  Vitals:   03/04/14 1408  BP: 111/49  Pulse: 62  Temp: 97.7 F (36.5 C)  Resp: 20    General: AAO x 3, no distress Cardiovascular: RRR, no murmurs  Respiratory: clear to auscultation bilaterally GI: soft, non-tender, non-distended, bowel sound positive  Discharge Instructions You were cared for by a hospitalist during your hospital stay. If you have any questions about your discharge medications or the care you received while you were in the hospital after you are discharged, you can call the unit and asked to speak with the hospitalist on call if the hospitalist that took care of you is not available. Once you are discharged, your primary care physician will handle any further medical issues. Please note that NO REFILLS for any discharge medications will be authorized once you are discharged, as it is imperative that you return to your primary care physician (or establish a relationship with a primary care physician if you do not have one) for your aftercare needs so that they can reassess your need for medications and monitor your lab values.     Medication List    ASK your doctor about these medications        levothyroxine 50 MCG tablet  Commonly known as:  SYNTHROID, LEVOTHROID  Take 50 mcg by mouth daily before breakfast.     multivitamin with minerals Tabs tablet  Take 1 tablet by mouth daily.     sertraline 100 MG tablet  Commonly known as:  ZOLOFT  Take 100 mg by mouth daily.     zolpidem 12.5 MG CR tablet  Commonly known as:  AMBIEN CR  Take 12.5 mg by mouth at bedtime as needed for sleep.  Allergies  Allergen Reactions  . Codeine Itching  . Neurontin [Gabapentin] Other (See Comments)    Seeing double  . Oxycodone Itching  . Prednisone Anxiety  . Levaquin [Levofloxacin] Other (See Comments)    Feeling tired      The results of significant diagnostics from this hospitalization (including imaging, microbiology, ancillary and laboratory) are listed  below for reference.    Significant Diagnostic Studies: Dg Chest 2 View  03/03/2014   CLINICAL DATA:  Left-sided chest pain and near syncope for 1 day  EXAM: CHEST  2 VIEW  COMPARISON:  None.  FINDINGS: Lungs are clear. Heart size and pulmonary vascularity are normal. No adenopathy. There is degenerative change in the thoracic spine. No pneumothorax.  IMPRESSION: No edema or consolidation.   Electronically Signed   By: Lowella Grip M.D.   On: 03/03/2014 16:58   Mm Digital Screening Bilateral  02/14/2014   CLINICAL DATA:  Screening.  EXAM: DIGITAL SCREENING BILATERAL MAMMOGRAM WITH CAD  COMPARISON:  Previous exam(s).  ACR Breast Density Category b: There are scattered areas of fibroglandular density.  FINDINGS: There are no findings suspicious for malignancy. Images were processed with CAD.  IMPRESSION: No mammographic evidence of malignancy. A result letter of this screening mammogram will be mailed directly to the patient.  RECOMMENDATION: Screening mammogram in one year. (Code:SM-B-01Y)  BI-RADS CATEGORY  1: Negative.   Electronically Signed   By: Marin Olp M.D.   On: 02/14/2014 12:44    Microbiology: No results found for this or any previous visit (from the past 240 hour(s)).   Labs: Basic Metabolic Panel:  Recent Labs Lab 03/03/14 1700  NA 145  K 3.5*  CL 103  CO2 30  GLUCOSE 107*  BUN 15  CREATININE 0.80  CALCIUM 9.3   Liver Function Tests: No results for input(s): AST, ALT, ALKPHOS, BILITOT, PROT, ALBUMIN in the last 168 hours. No results for input(s): LIPASE, AMYLASE in the last 168 hours. No results for input(s): AMMONIA in the last 168 hours. CBC:  Recent Labs Lab 03/03/14 1700  WBC 5.0  HGB 12.7  HCT 38.0  MCV 89.0  PLT 191   Cardiac Enzymes:  Recent Labs Lab 03/03/14 1700 03/04/14 0250 03/04/14 0547  TROPONINI <0.30 <0.30 <0.30   BNP: BNP (last 3 results) No results for input(s): PROBNP in the last 8760 hours. CBG: No results for input(s):  GLUCAP in the last 168 hours.     SignedDebbe Odea, MD [facility] 03/04/2014, 4:26 PM

## 2014-03-04 NOTE — Progress Notes (Signed)
  Echocardiogram 2D Echocardiogram has been performed.  Marie Maynard 03/04/2014, 12:30 PM

## 2014-03-05 DIAGNOSIS — Q2112 Patent foramen ovale: Secondary | ICD-10-CM | POA: Insufficient documentation

## 2014-03-05 DIAGNOSIS — Q211 Atrial septal defect: Secondary | ICD-10-CM

## 2014-03-05 LAB — BASIC METABOLIC PANEL
Anion gap: 13 (ref 5–15)
BUN: 11 mg/dL (ref 6–23)
CHLORIDE: 104 meq/L (ref 96–112)
CO2: 25 meq/L (ref 19–32)
Calcium: 9 mg/dL (ref 8.4–10.5)
Creatinine, Ser: 0.64 mg/dL (ref 0.50–1.10)
GFR calc Af Amer: >90 mL/min (ref >90)
GFR calc non Af Amer: >90 mL/min (ref >90)
Glucose, Bld: 93 mg/dL (ref 70–99)
Potassium: 3.8 mEq/L (ref 3.7–5.3)
Sodium: 142 mEq/L (ref 137–147)

## 2014-03-05 LAB — LIPID PANEL
CHOL/HDL RATIO: 4.7 ratio
Cholesterol: 207 mg/dL — ABNORMAL HIGH (ref 0–200)
HDL: 44 mg/dL (ref >39)
LDL Cholesterol: 143 mg/dL — ABNORMAL HIGH (ref 0–99)
Triglycerides: 101 mg/dL (ref <150)
VLDL: 20 mg/dL (ref 0–40)

## 2014-03-05 MED ORDER — ENOXAPARIN SODIUM 40 MG/0.4ML ~~LOC~~ SOLN: 40.0000 mg | SUBCUTANEOUS | Status: DC

## 2014-03-05 MED ORDER — T.E.D. BELOW KNEE/L-REGULAR MISC: 1.0000 | Freq: Every day | Status: DC

## 2014-03-05 MED ORDER — T.E.D. THIGH LENGTH/L-REGULAR MISC: 1.0000 | Freq: Every day | Status: DC

## 2014-03-05 NOTE — Progress Notes (Signed)
Utilization Review Completed.   Dorianna Mckiver, RN, BSN Nurse Case Manager  

## 2014-03-05 NOTE — Progress Notes (Signed)
[facility]  Patient examined. ECHO and plans for discharge discussed. Please see d/c my summary from 03/04/14 which I have updated today.   Debbe Odea, MD

## 2014-03-05 NOTE — Progress Notes (Addendum)
SUBJECTIVE:  No further chest pain  OBJECTIVE:   Vitals:   Filed Vitals:   03/04/14 0444 03/04/14 1408 03/04/14 2243 03/05/14 0614  BP: 88/52 111/49 100/58 97/58  Pulse: 60 62 65 71  Temp: 97.9 F (36.6 C) 97.7 F (36.5 C) 99 F (37.2 C) 98.3 F (36.8 C)  TempSrc: Oral Oral Oral Oral  Resp: 18 20 20 20   Height:      Weight: 153 lb 3.1 oz (69.488 kg)   149 lb (67.586 kg)  SpO2: 96% 98% 95% 95%   I&O's:   Intake/Output Summary (Last 24 hours) at 03/05/14 1245 Last data filed at 03/05/14 0900  Gross per 24 hour  Intake   1080 ml  Output   1100 ml  Net    -20 ml   TELEMETRY: Reviewed telemetry pt in normal sinus rhythm:     PHYSICAL EXAM General: Well developed, well nourished, in no acute distress Head:   Normal cephalic and atramatic  Lungs:   Clear bilaterally to auscultation. Heart:   HRRR S1 S2  No JVD.   Abdomen: abdomen soft and non-tender Msk:  Back normal,  Normal strength and tone for age. Extremities:   No edema.   Neuro: Alert and oriented. Psych:  Normal affect, responds appropriately Skin: No rash   LABS: Basic Metabolic Panel:  Recent Labs  03/03/14 1700 03/05/14 0341  NA 145 142  K 3.5* 3.8  CL 103 104  CO2 30 25  GLUCOSE 107* 93  BUN 15 11  CREATININE 0.80 0.64  CALCIUM 9.3 9.0   Liver Function Tests: No results for input(s): AST, ALT, ALKPHOS, BILITOT, PROT, ALBUMIN in the last 72 hours. No results for input(s): LIPASE, AMYLASE in the last 72 hours. CBC:  Recent Labs  03/03/14 1700  WBC 5.0  HGB 12.7  HCT 38.0  MCV 89.0  PLT 191   Cardiac Enzymes:  Recent Labs  03/03/14 1700 03/04/14 0250 03/04/14 0547  TROPONINI <0.30 <0.30 <0.30   BNP: Invalid input(s): POCBNP D-Dimer:  Recent Labs  03/04/14 0256  DDIMER 0.28   Hemoglobin A1C:  Recent Labs  03/04/14 0250  HGBA1C 5.4   Fasting Lipid Panel:  Recent Labs  03/05/14 0341  CHOL 207*  HDL 44  LDLCALC 143*  TRIG 101  CHOLHDL 4.7   Thyroid  Function Tests: No results for input(s): TSH, T4TOTAL, T3FREE, THYROIDAB in the last 72 hours.  Invalid input(s): FREET3 Anemia Panel: No results for input(s): VITAMINB12, FOLATE, FERRITIN, TIBC, IRON, RETICCTPCT in the last 72 hours. Coag Panel:   No results found for: INR, PROTIME  RADIOLOGY: Dg Chest 2 View  03/03/2014   CLINICAL DATA:  Left-sided chest pain and near syncope for 1 day  EXAM: CHEST  2 VIEW  COMPARISON:  None.  FINDINGS: Lungs are clear. Heart size and pulmonary vascularity are normal. No adenopathy. There is degenerative change in the thoracic spine. No pneumothorax.  IMPRESSION: No edema or consolidation.   Electronically Signed   By: Lowella Grip M.D.   On: 03/03/2014 16:58   Mm Digital Screening Bilateral  02/14/2014   CLINICAL DATA:  Screening.  EXAM: DIGITAL SCREENING BILATERAL MAMMOGRAM WITH CAD  COMPARISON:  Previous exam(s).  ACR Breast Density Category b: There are scattered areas of fibroglandular density.  FINDINGS: There are no findings suspicious for malignancy. Images were processed with CAD.  IMPRESSION: No mammographic evidence of malignancy. A result letter of this screening mammogram will be mailed directly to the patient.  RECOMMENDATION: Screening mammogram in one year. (Code:SM-B-01Y)  BI-RADS CATEGORY  1: Negative.   Electronically Signed   By: Marin Olp M.D.   On: 02/14/2014 12:44      ASSESSMENT: Marie Maynard:  She feels more relaxed today. Her symptoms are gone. Echocardiogram showed normal left jugular function. There was mention of a patent foramen ovale. We went over the significance of this abnormality. I don't think it is responsible for her symptoms. Continue medical therapy. If she has further symptoms, she can follow-up with Dr. Mare Ferrari and have an outpatient stress test. Okay to discharge from a cardiac standpoint.  Continue with risk factor modification and preventive therapy.  Jettie Booze, MD  03/05/2014  12:45 PM

## 2014-06-29 ENCOUNTER — Other Ambulatory Visit: Payer: Self-pay | Admitting: Orthopedic Surgery

## 2014-06-29 DIAGNOSIS — M25512 Pain in left shoulder: Secondary | ICD-10-CM

## 2014-07-17 ENCOUNTER — Other Ambulatory Visit: Payer: Self-pay | Admitting: Orthopaedic Surgery

## 2014-07-17 DIAGNOSIS — M4316 Spondylolisthesis, lumbar region: Secondary | ICD-10-CM

## 2014-07-21 ENCOUNTER — Other Ambulatory Visit: Payer: BC Managed Care – PPO

## 2014-07-30 ENCOUNTER — Ambulatory Visit
Admission: RE | Admit: 2014-07-30 | Discharge: 2014-07-30 | Disposition: A | Discharge status: Home / Self Care | Payer: BC Managed Care – PPO | Source: Ambulatory Visit | Attending: Orthopaedic Surgery | Admitting: Orthopaedic Surgery

## 2014-07-30 ENCOUNTER — Ambulatory Visit
Admission: RE | Admit: 2014-07-30 | Discharge: 2014-07-30 | Disposition: A | Discharge status: Home / Self Care | Payer: BC Managed Care – PPO | Source: Ambulatory Visit | Attending: Orthopedic Surgery | Admitting: Orthopedic Surgery

## 2014-07-30 DIAGNOSIS — M4316 Spondylolisthesis, lumbar region: Secondary | ICD-10-CM

## 2014-07-30 DIAGNOSIS — M25512 Pain in left shoulder: Secondary | ICD-10-CM

## 2014-08-09 ENCOUNTER — Telehealth: Payer: Self-pay | Admitting: Interventional Cardiology

## 2014-08-09 NOTE — Telephone Encounter (Signed)
Received records from [facility] forwarded to [facility] via fax 5/25/16fbg.

## 2015-09-03 ENCOUNTER — Other Ambulatory Visit: Payer: Self-pay | Admitting: Orthopedic Surgery

## 2015-09-03 DIAGNOSIS — M1711 Unilateral primary osteoarthritis, right knee: Secondary | ICD-10-CM

## 2015-09-03 DIAGNOSIS — M25561 Pain in right knee: Secondary | ICD-10-CM

## 2015-09-09 ENCOUNTER — Ambulatory Visit
Admission: RE | Admit: 2015-09-09 | Discharge: 2015-09-09 | Disposition: A | Discharge status: Home / Self Care | Payer: Medicare Other | Source: Ambulatory Visit | Attending: Orthopedic Surgery | Admitting: Orthopedic Surgery

## 2015-09-09 DIAGNOSIS — M25561 Pain in right knee: Secondary | ICD-10-CM

## 2015-09-09 DIAGNOSIS — M1711 Unilateral primary osteoarthritis, right knee: Secondary | ICD-10-CM

## 2015-10-08 ENCOUNTER — Ambulatory Visit: Payer: Self-pay | Admitting: Orthopedic Surgery

## 2015-10-22 ENCOUNTER — Encounter (HOSPITAL_COMMUNITY): Payer: Self-pay

## 2015-10-22 NOTE — Patient Instructions (Addendum)
TIENA HIRES  10/22/2015   Your procedure is scheduled on: 10/31/2015    Report to [facility]  Entrance take [place]  elevators to 3rd floor to  [place] at    [place] AM.  Call this number if you have problems the morning of surgery [phone]   Remember: ONLY 1 PERSON MAY GO WITH YOU TO SHORT STAY TO GET  READY MORNING OF YOUR SURGERY.  Do not eat food or drink liquids :After Midnight.     Take these medicines the morning of surgery with A SIP OF WATER: Synthroid, Nasonex if needed,  zoloft                                 You may not have any metal on your body including hair pins and              piercings  Do not wear jewelry, make-up, lotions, powders or perfumes, deodorant             Do not wear nail polish.  Do not shave  48 hours prior to surgery.                 Do not bring valuables to the hospital. [place].  Contacts, dentures or bridgework may not be worn into surgery.      Patients discharged the day of surgery will not be allowed to drive home.  Name and phone number of your driver:  Special Instructions: coughing and deep breathing exercises, leg exercises               Please read over the following fact sheets you were given: _____________________________________________________________________             [facility] - Preparing for Surgery Before surgery, you can play an important role.  Because skin is not sterile, your skin needs to be as free of germs as possible.  You can reduce the number of germs on your skin by washing with CHG (chlorahexidine gluconate) soap before surgery.  CHG is an antiseptic cleaner which kills germs and bonds with the skin to continue killing germs even after washing. Please DO NOT use if you have an allergy to CHG or antibacterial soaps.  If your skin becomes reddened/irritated stop using the CHG and inform your nurse when you arrive at  Short Stay. Do not shave (including legs and underarms) for at least 48 hours prior to the first CHG shower.  You may shave your face/neck. Please follow these instructions carefully:  1.  Shower with CHG Soap the night before surgery and the  morning of Surgery.  2.  If you choose to wash your hair, wash your hair first as usual with your  normal  shampoo.  3.  After you shampoo, rinse your hair and body thoroughly to remove the  shampoo.                           4.  Use CHG as you would any other liquid soap.  You can apply chg directly  to the skin and wash  Gently with a scrungie or clean washcloth.  5.  Apply the CHG Soap to your body ONLY FROM THE NECK DOWN.   Do not use on face/ open                           Wound or open sores. Avoid contact with eyes, ears mouth and genitals (private parts).                       Wash face,  Genitals (private parts) with your normal soap.             6.  Wash thoroughly, paying special attention to the area where your surgery  will be performed.  7.  Thoroughly rinse your body with warm water from the neck down.  8.  DO NOT shower/wash with your normal soap after using and rinsing off  the CHG Soap.                9.  Pat yourself dry with a clean towel.            10.  Wear clean pajamas.            11.  Place clean sheets on your bed the night of your first shower and do not  sleep with pets. Day of Surgery : Do not apply any lotions/deodorants the morning of surgery.  Please wear clean clothes to the hospital/surgery center.  FAILURE TO FOLLOW THESE INSTRUCTIONS MAY RESULT IN THE CANCELLATION OF YOUR SURGERY PATIENT SIGNATURE_________________________________  NURSE SIGNATURE__________________________________  ________________________________________________________________________

## 2015-10-24 ENCOUNTER — Encounter (INDEPENDENT_AMBULATORY_CARE_PROVIDER_SITE_OTHER): Payer: Self-pay

## 2015-10-24 ENCOUNTER — Encounter (HOSPITAL_COMMUNITY)
Admission: RE | Admit: 2015-10-24 | Discharge: 2015-10-24 | Disposition: A | Discharge status: Home / Self Care | Payer: Medicare Other | Source: Ambulatory Visit | Attending: Orthopedic Surgery | Admitting: Orthopedic Surgery

## 2015-10-24 ENCOUNTER — Encounter (HOSPITAL_COMMUNITY): Payer: Self-pay

## 2015-10-24 DIAGNOSIS — Z01812 Encounter for preprocedural laboratory examination: Secondary | ICD-10-CM | POA: Insufficient documentation

## 2015-10-24 HISTORY — DX: Malignant (primary) neoplasm, unspecified: C80.1

## 2015-10-24 LAB — SURGICAL PCR SCREEN
MRSA, PCR: NEGATIVE
Staphylococcus aureus: POSITIVE — AB

## 2015-10-24 LAB — CBC
HCT: 42.6 % (ref 36.0–46.0)
HEMOGLOBIN: 14 g/dL (ref 12.0–15.0)
MCH: 28.7 pg (ref 26.0–34.0)
MCHC: 32.9 g/dL (ref 30.0–36.0)
MCV: 87.5 fL (ref 78.0–100.0)
PLATELETS: 180 10*3/uL (ref 150–400)
RBC: 4.87 MIL/uL (ref 3.87–5.11)
RDW: 13.3 % (ref 11.5–15.5)
WBC: 5.1 10*3/uL (ref 4.0–10.5)

## 2015-10-31 ENCOUNTER — Encounter (HOSPITAL_COMMUNITY)
Admission: RE | Disposition: A | Discharge status: Home / Self Care | Payer: Self-pay | Source: Ambulatory Visit | Attending: Orthopedic Surgery

## 2015-10-31 ENCOUNTER — Ambulatory Visit (HOSPITAL_COMMUNITY): Payer: Medicare Other | Admitting: Certified Registered Nurse Anesthetist

## 2015-10-31 ENCOUNTER — Encounter (HOSPITAL_COMMUNITY): Payer: Self-pay | Admitting: Orthopedic Surgery

## 2015-10-31 ENCOUNTER — Ambulatory Visit (HOSPITAL_COMMUNITY)
Admission: RE | Admit: 2015-10-31 | Discharge: 2015-10-31 | Disposition: A | Discharge status: Home / Self Care | Payer: Medicare Other | Source: Ambulatory Visit | Attending: Orthopedic Surgery | Admitting: Orthopedic Surgery

## 2015-10-31 DIAGNOSIS — M25561 Pain in right knee: Secondary | ICD-10-CM | POA: Diagnosis present

## 2015-10-31 DIAGNOSIS — M94261 Chondromalacia, right knee: Secondary | ICD-10-CM | POA: Diagnosis not present

## 2015-10-31 DIAGNOSIS — S83289A Other tear of lateral meniscus, current injury, unspecified knee, initial encounter: Secondary | ICD-10-CM | POA: Diagnosis present

## 2015-10-31 DIAGNOSIS — X58XXXA Exposure to other specified factors, initial encounter: Secondary | ICD-10-CM | POA: Insufficient documentation

## 2015-10-31 DIAGNOSIS — S83281A Other tear of lateral meniscus, current injury, right knee, initial encounter: Secondary | ICD-10-CM | POA: Diagnosis not present

## 2015-10-31 DIAGNOSIS — E039 Hypothyroidism, unspecified: Secondary | ICD-10-CM | POA: Insufficient documentation

## 2015-10-31 DIAGNOSIS — T8859XA Other complications of anesthesia, initial encounter: Secondary | ICD-10-CM

## 2015-10-31 DIAGNOSIS — Z79899 Other long term (current) drug therapy: Secondary | ICD-10-CM | POA: Diagnosis not present

## 2015-10-31 HISTORY — PX: KNEE ARTHROSCOPY WITH LATERAL MENISECTOMY: SHX6193

## 2015-10-31 HISTORY — DX: Other complications of anesthesia, initial encounter: T88.59XA

## 2015-10-31 LAB — POCT I-STAT 4, (NA,K, GLUC, HGB,HCT)
Glucose, Bld: 85 mg/dL (ref 65–99)
HCT: 39 % (ref 36.0–46.0)
Hemoglobin: 13.3 g/dL (ref 12.0–15.0)
Potassium: 4 mmol/L (ref 3.5–5.1)
Sodium: 144 mmol/L (ref 135–145)

## 2015-10-31 SURGERY — ARTHROSCOPY, KNEE, WITH LATERAL MENISCECTOMY
Anesthesia: General | Site: Knee | Laterality: Right

## 2015-10-31 MED ORDER — LIDOCAINE HCL (CARDIAC) 20 MG/ML IV SOLN
INTRAVENOUS | Status: DC | PRN
  Administered 2015-10-31: 100 mg via INTRAVENOUS

## 2015-10-31 MED ORDER — HYDROMORPHONE HCL 1 MG/ML IJ SOLN
INTRAMUSCULAR | Status: AC
  Filled 2015-10-31: qty 1

## 2015-10-31 MED ORDER — HYDROMORPHONE HCL 2 MG PO TABS: 2.0000 mg | ORAL_TABLET | Freq: Once | ORAL | Status: DC

## 2015-10-31 MED ORDER — PROPOFOL 10 MG/ML IV BOLUS
INTRAVENOUS | Status: DC | PRN
  Administered 2015-10-31: 130 mg via INTRAVENOUS

## 2015-10-31 MED ORDER — BUPIVACAINE-EPINEPHRINE (PF) 0.25% -1:200000 IJ SOLN
INTRAMUSCULAR | Status: AC
  Filled 2015-10-31: qty 30

## 2015-10-31 MED ORDER — FENTANYL CITRATE (PF) 100 MCG/2ML IJ SOLN
INTRAMUSCULAR | Status: DC | PRN
  Administered 2015-10-31: 25 ug via INTRAVENOUS
  Administered 2015-10-31 (×2): 50 ug via INTRAVENOUS

## 2015-10-31 MED ORDER — BUPIVACAINE-EPINEPHRINE 0.25% -1:200000 IJ SOLN
INTRAMUSCULAR | Status: DC | PRN
  Administered 2015-10-31: 20 mL

## 2015-10-31 MED ORDER — ACETAMINOPHEN 10 MG/ML IV SOLN
1000.0000 mg | Freq: Once | INTRAVENOUS | Status: AC
  Administered 2015-10-31: 1000 mg via INTRAVENOUS

## 2015-10-31 MED ORDER — LIDOCAINE HCL (CARDIAC) 20 MG/ML IV SOLN
INTRAVENOUS | Status: AC
  Filled 2015-10-31: qty 5

## 2015-10-31 MED ORDER — ONDANSETRON HCL 4 MG/2ML IJ SOLN
INTRAMUSCULAR | Status: DC | PRN
  Administered 2015-10-31: 4 mg via INTRAVENOUS

## 2015-10-31 MED ORDER — PROMETHAZINE HCL 25 MG/ML IJ SOLN
6.2500 mg | INTRAMUSCULAR | Status: AC | PRN
  Administered 2015-10-31 (×2): 6.25 mg via INTRAVENOUS

## 2015-10-31 MED ORDER — CEFAZOLIN SODIUM-DEXTROSE 2-4 GM/100ML-% IV SOLN
INTRAVENOUS | Status: AC
  Filled 2015-10-31: qty 100

## 2015-10-31 MED ORDER — SODIUM CHLORIDE 0.9 % IJ SOLN
INTRAMUSCULAR | Status: AC
  Filled 2015-10-31: qty 10

## 2015-10-31 MED ORDER — HYDROMORPHONE HCL 1 MG/ML IJ SOLN
0.2500 mg | INTRAMUSCULAR | Status: DC | PRN
  Administered 2015-10-31 (×2): 0.5 mg via INTRAVENOUS

## 2015-10-31 MED ORDER — ACETAMINOPHEN 10 MG/ML IV SOLN
INTRAVENOUS | Status: AC
  Filled 2015-10-31: qty 100

## 2015-10-31 MED ORDER — ONDANSETRON HCL 4 MG/2ML IJ SOLN: 4.0000 mg | Freq: Four times a day (QID) | INTRAMUSCULAR | Status: DC | PRN

## 2015-10-31 MED ORDER — HYDROMORPHONE HCL 2 MG PO TABS
2.0000 mg | ORAL_TABLET | Freq: Once | ORAL | Status: DC
Start: 2015-10-31 — End: 2015-10-31

## 2015-10-31 MED ORDER — HYDROMORPHONE HCL 2 MG PO TABS: 2.0000 mg | ORAL_TABLET | ORAL | 0 refills | Status: DC | PRN

## 2015-10-31 MED ORDER — FENTANYL CITRATE (PF) 100 MCG/2ML IJ SOLN
INTRAMUSCULAR | Status: AC
  Filled 2015-10-31: qty 2

## 2015-10-31 MED ORDER — PROPOFOL 10 MG/ML IV BOLUS
INTRAVENOUS | Status: AC
  Filled 2015-10-31: qty 40

## 2015-10-31 MED ORDER — EPHEDRINE SULFATE 50 MG/ML IJ SOLN
INTRAMUSCULAR | Status: AC
  Filled 2015-10-31: qty 1

## 2015-10-31 MED ORDER — ONDANSETRON HCL 4 MG/2ML IJ SOLN
INTRAMUSCULAR | Status: AC
Start: 2015-10-31 — End: 2015-10-31
  Filled 2015-10-31: qty 2

## 2015-10-31 MED ORDER — DEXAMETHASONE SODIUM PHOSPHATE 10 MG/ML IJ SOLN
INTRAMUSCULAR | Status: AC
  Filled 2015-10-31: qty 1

## 2015-10-31 MED ORDER — CEFAZOLIN SODIUM-DEXTROSE 2-4 GM/100ML-% IV SOLN
2.0000 g | INTRAVENOUS | Status: AC
  Administered 2015-10-31: 2 g via INTRAVENOUS
  Filled 2015-10-31: qty 100

## 2015-10-31 MED ORDER — LACTATED RINGERS IV SOLN
INTRAVENOUS | Status: DC
  Administered 2015-10-31: 09:00:00 via INTRAVENOUS

## 2015-10-31 MED ORDER — FENTANYL CITRATE (PF) 250 MCG/5ML IJ SOLN
INTRAMUSCULAR | Status: AC
  Filled 2015-10-31: qty 5

## 2015-10-31 MED ORDER — MIDAZOLAM HCL 2 MG/2ML IJ SOLN
INTRAMUSCULAR | Status: AC
  Filled 2015-10-31: qty 2

## 2015-10-31 MED ORDER — CHLORHEXIDINE GLUCONATE 4 % EX LIQD: 60.0000 mL | Freq: Once | CUTANEOUS | Status: DC

## 2015-10-31 MED ORDER — PROMETHAZINE HCL 25 MG/ML IJ SOLN
INTRAMUSCULAR | Status: AC
  Filled 2015-10-31: qty 1

## 2015-10-31 MED ORDER — MIDAZOLAM HCL 5 MG/5ML IJ SOLN
INTRAMUSCULAR | Status: DC | PRN
  Administered 2015-10-31: 2 mg via INTRAVENOUS

## 2015-10-31 MED ORDER — POVIDONE-IODINE 10 % EX SWAB: 2.0000 "application " | Freq: Once | CUTANEOUS | Status: DC

## 2015-10-31 MED ORDER — METHOCARBAMOL 500 MG PO TABS: 500.0000 mg | ORAL_TABLET | Freq: Four times a day (QID) | ORAL | 1 refills | Status: DC

## 2015-10-31 MED ORDER — LACTATED RINGERS IR SOLN
Status: DC | PRN
  Administered 2015-10-31: 15000 mL

## 2015-10-31 MED ORDER — DEXAMETHASONE SODIUM PHOSPHATE 10 MG/ML IJ SOLN
10.0000 mg | Freq: Once | INTRAMUSCULAR | Status: AC
  Administered 2015-10-31: 10 mg via INTRAVENOUS

## 2015-10-31 MED ORDER — ONDANSETRON HCL 4 MG PO TABS: 4.0000 mg | ORAL_TABLET | Freq: Three times a day (TID) | ORAL | 0 refills | Status: DC | PRN

## 2015-10-31 SURGICAL SUPPLY — 26 items
BANDAGE ACE 6X5 VEL STRL LF (GAUZE/BANDAGES/DRESSINGS) ×3 IMPLANT
BLADE 4.2CUDA (BLADE) ×3 IMPLANT
COVER SURGICAL LIGHT HANDLE (MISCELLANEOUS) ×3 IMPLANT
CUFF TOURN SGL QUICK 34 (TOURNIQUET CUFF) ×3
CUFF TRNQT CYL 34X4X40X1 (TOURNIQUET CUFF) ×1 IMPLANT
DRAPE U-SHAPE 47X51 STRL (DRAPES) ×3 IMPLANT
DRSG EMULSION OIL 3X3 NADH (GAUZE/BANDAGES/DRESSINGS) ×3 IMPLANT
DRSG PAD ABDOMINAL 8X10 ST (GAUZE/BANDAGES/DRESSINGS) ×3 IMPLANT
DURAPREP 26ML APPLICATOR (WOUND CARE) ×3 IMPLANT
GAUZE SPONGE 4X4 12PLY STRL (GAUZE/BANDAGES/DRESSINGS) ×3 IMPLANT
GLOVE BIO SURGEON STRL SZ8 (GLOVE) ×3 IMPLANT
GLOVE BIOGEL PI IND STRL 8 (GLOVE) ×1 IMPLANT
GLOVE BIOGEL PI INDICATOR 8 (GLOVE) ×2
GOWN STRL REUS W/TWL LRG LVL3 (GOWN DISPOSABLE) ×3 IMPLANT
KIT BASIN OR (CUSTOM PROCEDURE TRAY) ×3 IMPLANT
MANIFOLD NEPTUNE II (INSTRUMENTS) ×3 IMPLANT
MARKER SKIN DUAL TIP RULER LAB (MISCELLANEOUS) ×3 IMPLANT
PACK ARTHROSCOPY WL (CUSTOM PROCEDURE TRAY) ×3 IMPLANT
PACK ICE MAXI GEL EZY WRAP (MISCELLANEOUS) ×9 IMPLANT
PADDING CAST COTTON 6X4 STRL (CAST SUPPLIES) ×6 IMPLANT
POSITIONER SURGICAL ARM (MISCELLANEOUS) ×3 IMPLANT
SUT ETHILON 4 0 PS 2 18 (SUTURE) ×3 IMPLANT
TOWEL OR 17X26 10 PK STRL BLUE (TOWEL DISPOSABLE) ×3 IMPLANT
TUBING ARTHRO INFLOW-ONLY STRL (TUBING) ×3 IMPLANT
WAND HAND CNTRL MULTIVAC 90 (MISCELLANEOUS) IMPLANT
WRAP KNEE MAXI GEL POST OP (GAUZE/BANDAGES/DRESSINGS) ×3 IMPLANT

## 2015-10-31 NOTE — Anesthesia Preprocedure Evaluation (Signed)
Anesthesia Evaluation  Patient identified by MRN, date of birth, ID band Patient awake    Reviewed: Allergy & Precautions, NPO status , Patient's Chart, lab work & pertinent test results  Airway Mallampati: II   Neck ROM: full    Dental   Pulmonary neg pulmonary ROS,    breath sounds clear to auscultation       Cardiovascular negative cardio ROS   Rhythm:regular Rate:Normal     Neuro/Psych    GI/Hepatic   Endo/Other  Hypothyroidism   Renal/GU      Musculoskeletal   Abdominal   Peds  Hematology   Anesthesia Other Findings   Reproductive/Obstetrics                             Anesthesia Physical Anesthesia Plan  ASA: II  Anesthesia Plan: General   Post-op Pain Management:    Induction: Intravenous  Airway Management Planned: LMA  Additional Equipment:   Intra-op Plan:   Post-operative Plan:   Informed Consent: I have reviewed the patients History and Physical, chart, labs and discussed the procedure including the risks, benefits and alternatives for the proposed anesthesia with the patient or authorized representative who has indicated his/her understanding and acceptance.     Plan Discussed with: CRNA, Anesthesiologist and Surgeon  Anesthesia Plan Comments:         Anesthesia Quick Evaluation

## 2015-10-31 NOTE — Anesthesia Postprocedure Evaluation (Signed)
Anesthesia Post Note  Patient: Marie Maynard  Procedure(s) Performed: Procedure(s) (LRB): RIGHT KNEE ARTHROSCOPY WITH MENISCAL DEBRIDEMENT (Right)  Patient location during evaluation: PACU Anesthesia Type: General Level of consciousness: awake and alert and patient cooperative Pain management: pain level controlled Vital Signs Assessment: post-procedure vital signs reviewed and stable Respiratory status: spontaneous breathing and respiratory function stable Cardiovascular status: stable Anesthetic complications: no    Last Vitals:  Vitals:   10/31/15 1200 10/31/15 1215  BP: 131/68 118/67  Pulse: 77 78  Resp: 12 14  Temp: 36.4 C     Last Pain:  Vitals:   10/31/15 1215  TempSrc:   PainSc: [place]

## 2015-10-31 NOTE — Discharge Instructions (Signed)
° °Dr. Abrie Egloff °Total Joint Specialist °[facility] °[address] °, [place] [address] °[phone] ° ° °Arthroscopic Procedure, Knee °An arthroscopic procedure can find what is wrong with your knee. °PROCEDURE °Arthroscopy is a surgical technique that allows your orthopedic surgeon to diagnose and treat your knee injury with accuracy. They will look into your knee through a small instrument. This is almost like a small (pencil sized) telescope. Because arthroscopy affects your knee less than open knee surgery, you can anticipate a more rapid recovery. Taking an active role by following your caregiver's instructions will help with rapid and complete recovery. Use crutches, rest, elevation, ice, and knee exercises as instructed. The length of recovery depends on various factors including type of injury, age, physical condition, medical conditions, and your rehabilitation. °Your knee is the joint between the large bones (femur and tibia) in your leg. Cartilage covers these bone ends which are smooth and slippery and allow your knee to bend and move smoothly. Two menisci, thick, semi-lunar shaped pads of cartilage which form a rim inside the joint, help absorb shock and stabilize your knee. Ligaments bind the bones together and support your knee joint. Muscles move the joint, help support your knee, and take stress off the joint itself. Because of this all programs and physical therapy to rehabilitate an injured or repaired knee require rebuilding and strengthening your muscles. °AFTER THE PROCEDURE °· After the procedure, you will be moved to a recovery area until most of the effects of the medication have worn off. Your caregiver will discuss the test results with you.  °· Only take over-the-counter or prescription medicines for pain, discomfort, or fever as directed by your caregiver.  °SEEK MEDICAL CARE IF:  °· You have increased bleeding from your wounds.  °· You see  redness, swelling, or have increasing pain in your wounds.  °· You have pus coming from your wound.  °· You have an oral temperature above 102° F (38.9° C).  °· You notice a bad smell coming from the wound or dressing.  °· You have severe pain with any motion of your knee.  °SEEK IMMEDIATE MEDICAL CARE IF:  °· You develop a rash.  °· You have difficulty breathing.  °· You have any allergic problems.  °FURTHER INSTRUCTIONS:  °· ICE to the affected knee every three hours for 30 minutes at a time and then as needed for pain and swelling.  Continue to use ice on the knee for pain and swelling from surgery. You may notice swelling that will progress down to the foot and ankle.  This is normal after surgery.  Elevate the leg when you are not up walking on it.   ° °DIET °You may resume your previous home diet once your are discharged from the hospital. ° °DRESSING / WOUND CARE / SHOWERING °You may start showering two days after being discharged home but do not submerge the incisions under water.  °Change dressing 48 hours after the procedure and then cover the small incisions with band aids until your follow up visit. °Change the surgical dressings daily and reapply a dry dressing each time.  ° °ACTIVITY °Walk with your walker as instructed. °Use walker as long as suggested by your caregivers. °Avoid periods of inactivity such as sitting longer than an hour when not asleep. This helps prevent blood clots.  °You may resume a sexual relationship in one month or when given the OK by your doctor.  °You may return to   work once you are cleared by your doctor.  °Do not drive a car for 6 weeks or until released by you surgeon.  °Do not drive while taking narcotics. ° °WEIGHT BEARING AS TOLERATED ° °POSTOPERATIVE CONSTIPATION PROTOCOL °Constipation - defined medically as fewer than three stools per week and severe constipation as less than one stool per week. ° °One of the most common issues patients have following surgery is  constipation.  Even if you have a regular bowel pattern at home, your normal regimen is likely to be disrupted due to multiple reasons following surgery.  Combination of anesthesia, postoperative narcotics, change in appetite and fluid intake all can affect your bowels.  In order to avoid complications following surgery, here are some recommendations in order to help you during your recovery period. ° °Colace (docusate) - Pick up an over-the-counter form of Colace or another stool softener and take twice a day as long as you are requiring postoperative pain medications.  Take with a full glass of water daily.  If you experience loose stools or diarrhea, hold the colace until you stool forms back up.  If your symptoms do not get better within 1 week or if they get worse, check with your doctor. ° °Dulcolax (bisacodyl) - Pick up over-the-counter and take as directed by the product packaging as needed to assist with the movement of your bowels.  Take with a full glass of water.  Use this product as needed if not relieved by Colace only.  ° °MiraLax (polyethylene glycol) - Pick up over-the-counter to have on hand.  MiraLax is a solution that will increase the amount of water in your bowels to assist with bowel movements.  Take as directed and can mix with a glass of water, juice, soda, coffee, or tea.  Take if you go more than two days without a movement. °Do not use MiraLax more than once per day. Call your doctor if you are still constipated or irregular after using this medication for 7 days in a row. ° °If you continue to have problems with postoperative constipation, please contact the office for further assistance and recommendations.  If you experience "the worst abdominal pain ever" or develop nausea or vomiting, please contact the office immediatly for further recommendations for treatment. ° °ITCHING ° If you experience itching with your medications, try taking only a single pain pill, or even half a pain pill  at a time.  You can also use Benadryl over the counter for itching or also to help with sleep.  ° °TED HOSE STOCKINGS °Wear the elastic stockings on both legs for three weeks following surgery during the day but you may remove then at night for sleeping. ° °MEDICATIONS °See your medication summary on the “After Visit Summary” that the nursing staff will review with you prior to discharge.  You may have some home medications which will be placed on hold until you complete the course of blood thinner medication.  It is important for you to complete the blood thinner medication as prescribed by your surgeon.  Continue your approved medications as instructed at time of discharge. °Do not drive while taking narcotics.  ° °PRECAUTIONS °If you experience chest pain or shortness of breath - call 911 immediately for transfer to the hospital emergency department.  °If you develop a fever greater that 101 F, purulent drainage from wound, increased redness or drainage from wound, foul odor from the wound/dressing, or calf pain - CONTACT YOUR SURGEON.   °                                                °  FOLLOW-UP APPOINTMENTS °Make sure you keep all of your appointments after your operation with your surgeon and caregivers. You should call the office at [phone]  and make an appointment for approximately one week after the date of your surgery or on the date instructed by your surgeon outlined in the "After Visit Summary". ° °RANGE OF MOTION AND STRENGTHENING EXERCISES  °Rehabilitation of the knee is important following a knee injury or an operation. After just a few days of immobilization, the muscles of the thigh which control the knee become weakened and shrink (atrophy). Knee exercises are designed to build up the tone and strength of the thigh muscles and to improve knee motion. Often times heat used for twenty to thirty minutes before working out will loosen up your tissues and help with improving the range of motion  but do not use heat for the first two weeks following surgery. These exercises can be done on a training (exercise) mat, on the floor, on a table or on a bed. Use what ever works the best and is most comfortable for you Knee exercises include: ° °QUAD STRENGTHENING EXERCISES °Strengthening Quadriceps Sets ° °Tighten muscles on top of thigh by pushing knees down into floor or table. °Hold for 20 seconds. Repeat 10 times. °Do 2 sessions per day. ° ° ° ° °Strengthening Terminal Knee Extension ° °With knee bent over bolster, straighten knee by tightening muscle on top of thigh. Be sure to keep bottom of knee on bolster. °Hold for 20 seconds. Repeat 10 times. °Do 2 sessions per day. ° ° °Straight Leg with Bent Knee ° °Lie on back with opposite leg bent. Keep involved knee slightly bent at knee and raise leg 4-6". Hold for 10 seconds. °Repeat 20 times per set. °Do 2 sets per session. °Do 2 sessions per day. ° °

## 2015-10-31 NOTE — Transfer of Care (Signed)
Immediate Anesthesia Transfer of Care Note  Patient: Marie Maynard  Procedure(s) Performed: Procedure(s): RIGHT KNEE ARTHROSCOPY WITH MENISCAL DEBRIDEMENT (Right)  Patient Location: PACU  Anesthesia Type:General  Level of Consciousness:  sedated, patient cooperative and responds to stimulation  Airway & Oxygen Therapy:Patient Spontanous Breathing and Patient connected to face mask oxgen  Post-op Assessment:  Report given to PACU RN and Post -op Vital signs reviewed and stable  Post vital signs:  Reviewed and stable  Last Vitals:  Vitals:   10/31/15 0815  BP: 115/64  Pulse: 71  Resp: 16  Temp: [id number] C    Complications: No apparent anesthesia complications

## 2015-10-31 NOTE — H&P (Signed)
  CC- Marie Maynard is a 65 y.o. female who presents with right knee pain.  HPI- . Knee Pain: Patient presents with knee pain involving the  right knee. Onset of the symptoms was several months ago. Inciting event: none known. Current symptoms include giving out, locking and pain located laterally. Pain is aggravated by lateral movements, pivoting, rising after sitting and squatting.  Patient has had no prior knee problems. Evaluation to date: MRI: abnormal lateral meniscal tear. Treatment to date: rest.  Past Medical History:  Diagnosis Date  . 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 Hypothyroidism     Past Surgical History:  Procedure Laterality Date  . ABDOMINAL HYSTERECTOMY     partial   . APPENDECTOMY    . BREAST BIOPSY     left   . CARPAL TUNNEL RELEASE     left   . CHOLECYSTECTOMY    . CYSTOCELE REPAIR    . lumbar injections      x 2  . spinal radiofrequency ablation      x 3  . TONSILLECTOMY      Prior to Admission medications   Medication Sig Start Date End Date Taking? Authorizing Provider  levothyroxine (SYNTHROID, LEVOTHROID) 50 MCG tablet Take 50 mcg by mouth daily before breakfast.   Yes Historical Provider, MD  mometasone (NASONEX) 50 MCG/ACT nasal spray Place 2 sprays into the nose daily as needed.   Yes Historical Provider, MD  Multiple Vitamin (MULTIVITAMIN WITH MINERALS) TABS tablet Take 1 tablet by mouth daily.   Yes Historical Provider, MD  nitrofurantoin (MACRODANTIN) 100 MG capsule Take 100 mg by mouth daily as needed. For bladder   Yes Historical Provider, MD  pregabalin (LYRICA) 50 MG capsule Take 50 mg by mouth daily.   Yes Historical Provider, MD  sertraline (ZOLOFT) 100 MG tablet Take 100 mg by mouth daily.   Yes Historical Provider, MD  Elastic Bandages & Supports (T.E.D. BELOW KNEE/L-REGULAR) MISC 1 each by Does not apply route daily. 03/05/14   Debbe Odea, MD  Elastic Bandages & Supports (T.E.D. THIGH LENGTH/L-REGULAR)  MISC 1 each by Does not apply route daily. 03/05/14   Debbe Odea, MD   KNEE EXAM antalgic gait, soft tissue tenderness over lateral joint line, no effusion, negative pivot-shift, collateral ligaments intact  Physical Examination: General appearance - alert, well appearing, and in no distress Mental status - alert, oriented to person, place, and time Chest - clear to auscultation, no wheezes, rales or rhonchi, symmetric air entry Heart - normal rate, regular rhythm, normal S1, S2, no murmurs, rubs, clicks or gallops Abdomen - soft, nontender, nondistended, no masses or organomegaly Neurological - alert, oriented, normal speech, no focal findings or movement disorder noted   Asessment/Plan--- Right knee lateral meniscal tear- - Plan right knee arthroscopy with meniscal debridement. Procedure risks and potential comps discussed with patient who elects to proceed. Goals are decreased pain and increased function with a high likelihood of achieving both

## 2015-10-31 NOTE — Anesthesia Procedure Notes (Signed)
Procedure Name: MAC Date/Time: 10/31/2015 10:09 AM Performed by: West Pugh Pre-anesthesia Checklist: Patient identified, Emergency Drugs available, Suction available, Patient being monitored and Timeout performed Patient Re-evaluated:Patient Re-evaluated prior to inductionOxygen Delivery Method: Circle system utilized Preoxygenation: Pre-oxygenation with 100% oxygen Intubation Type: IV induction Ventilation: Mask ventilation without difficulty LMA: LMA inserted LMA Size: 4.0 Number of attempts: 1 Placement Confirmation: CO2 detector,  positive ETCO2 and breath sounds checked- equal and bilateral Tube secured with: Tape Dental Injury: Teeth and Oropharynx as per pre-operative assessment

## 2015-10-31 NOTE — Interval H&P Note (Signed)
History and Physical Interval Note:  10/31/2015 9:41 AM  Marie Maynard  has presented today for surgery, with the diagnosis of RIGHT LATERAL MENISCAL TEAR   The various methods of treatment have been discussed with the patient and family. After consideration of risks, benefits and other options for treatment, the patient has consented to  Procedure(s): RIGHT KNEE ARTHROSCOPY WITH MENISCAL DEBRIDEMENT (Right) as a surgical intervention .  The patient's history has been reviewed, patient examined, no change in status, stable for surgery.  I have reviewed the patient's chart and labs.  Questions were answered to the patient's satisfaction.     Gearlean Alf

## 2015-10-31 NOTE — Op Note (Signed)
Operative Report- KNEE ARTHROSCOPY  Preoperative diagnosis-  Right knee lateral meniscal tear  Postoperative diagnosis Right- knee lateral meniscal tear plus  Chondral defect lateral femoral condyle  Procedure- Right knee arthroscopy with lateral  meniscal debridement and chondroplasty   Surgeon- Dione Plover. Delberta Folts, MD  Anesthesia-General  EBL-  Minimal  Complications- None  Condition- PACU - hemodynamically stable.  Brief clinical note- -Marie Maynard is a 65 y.o.  female with a several month history of Right pain and mechanical symptoms. Exam and history suggested lateral  meniscal tear confirmed by MRI. The patient presents now for arthroscopy and debridement   Procedure in detail -       After successful administration of General anesthetic, a tourmiquet is placed high on the Right  thigh and the Right lower extremity is prepped and draped in the usual sterile fashion. Time out is performed by the surgical team. Standard superomedial and inferolateral portal sites are marked and incisions made with an 11 blade. The inflow cannula is passed through the superomedial portal and camera through the inferolateral portal and inflow is initiated. Arthroscopic visualization proceeds.      The undersurface of the patella and trochlea are visualized and there is grade II chondromalacia of both surfaces with no unstable chondral lesions. The medial and lateral gutters are visualized and there are  no loose bodies. Flexion and valgus force is applied to the knee and the medial compartment is entered. A spinal needle is passed into the joint through the site marked for the inferomedial portal. A small incision is made and the dilator passed into the joint. The findings for the medial compartment are Grade II chondromalacia medial femoral condyle with no unstable lesions .     The intercondylar notch is visualized and the ACL appears normal . The lateral compartment is entered and the findings  are unstable tear anterior horn body and posterior horn lateral meniscus with unstable 1 x 1 cm chondral defect lateral femoral condyle . The tear is debrided to a stable base with baskets and a shaver and sealed off with the Arthrocare. The shaver is used to debride the unstable cartilage to a stable cartilaginous  base with stable edges. It is probed and found to be stable. Thre is also a large amount of hypertrophic synovial tissue around the lateral compartment which is removed with the Arthrocare device.     The joint is again inspected and there are no other tears, defects or loose bodies identified. The arthroscopic equipment is then removed from the inferior portals which are closed with interrupted 4-0 nylon. 20 ml of .25% Marcaine with epinephrine are injected through the inflow cannula and the cannula is then removed and the portal closed with nylon. The incisions are cleaned and dried and a bulky sterile dressing is applied. The patient is then awakened and transported to recovery in stable condition.   10/31/2015, 10:55 AM

## 2016-01-05 ENCOUNTER — Other Ambulatory Visit: Payer: Self-pay | Admitting: Family Medicine

## 2016-01-05 DIAGNOSIS — E042 Nontoxic multinodular goiter: Secondary | ICD-10-CM

## 2016-01-07 ENCOUNTER — Other Ambulatory Visit: Payer: Self-pay | Admitting: Family Medicine

## 2016-01-07 DIAGNOSIS — Z1231 Encounter for screening mammogram for malignant neoplasm of breast: Secondary | ICD-10-CM

## 2016-01-11 ENCOUNTER — Ambulatory Visit
Admission: RE | Admit: 2016-01-11 | Discharge: 2016-01-11 | Disposition: A | Discharge status: Home / Self Care | Payer: Medicare Other | Source: Ambulatory Visit | Attending: Family Medicine | Admitting: Family Medicine

## 2016-01-11 DIAGNOSIS — E042 Nontoxic multinodular goiter: Secondary | ICD-10-CM

## 2016-01-28 ENCOUNTER — Ambulatory Visit: Payer: Medicare Other

## 2016-02-18 ENCOUNTER — Ambulatory Visit
Admission: RE | Admit: 2016-02-18 | Discharge: 2016-02-18 | Disposition: A | Discharge status: Home / Self Care | Payer: Medicare Other | Source: Ambulatory Visit | Attending: Family Medicine | Admitting: Family Medicine

## 2016-02-18 DIAGNOSIS — Z1231 Encounter for screening mammogram for malignant neoplasm of breast: Secondary | ICD-10-CM

## 2016-04-18 ENCOUNTER — Ambulatory Visit: Payer: Self-pay | Admitting: Orthopedic Surgery

## 2016-04-28 NOTE — Patient Instructions (Signed)
Marie Maynard  04/28/2016   Your procedure is scheduled on: Monday 05/05/2016  Report to [facility]  Entrance take [facility]  elevators to 3rd floor to  [place] at  1045  AM.  Call this number if you have problems the morning of surgery [phone]   Remember: ONLY 1 PERSON MAY GO WITH YOU TO SHORT STAY TO GET  READY MORNING OF [place].    Do not eat food  :After Midnight.  MAY HAVE CLEAR LIQUIDS FROM MIDNIGHT UP UNTIL 0745 AM THEN NOTHING UNTIL AFTER SURGERY!            CLEAR LIQUID DIET   Foods Allowed                                                                     Foods Excluded  Coffee and tea, regular and decaf                             liquids that you cannot  Plain Jell-O in any flavor                                             see through such as: Fruit ices (not with fruit pulp)                                     milk, soups, orange juice  Iced Popsicles                                    All solid food Carbonated beverages, regular and diet                                    Cranberry, grape and apple juices Sports drinks like Gatorade Lightly seasoned clear broth or consume(fat free) Sugar, honey syrup  Sample Menu Breakfast                                Lunch                                     Supper Cranberry juice                    Beef broth                            Chicken broth Jell-O  Grape juice                           Apple juice Coffee or tea                        Jell-O                                      Popsicle                                                Coffee or tea                        Coffee or tea  _____________________________________________________________________    Take these medicines the morning of surgery with A SIP OF WATER: Levothyroxine, Nasonex nasal spray if needed, Sertraline (Zoloft)                                  You may  not have any metal on your body including hair pins and              piercings  Do not wear jewelry, make-up, lotions, powders or perfumes, deodorant             Do not wear nail polish.  Do not shave  48 hours prior to surgery.              Men may shave face and neck.   Do not bring valuables to the hospital. [place].  Contacts, dentures or bridgework may not be worn into surgery.  Leave suitcase in the car. After surgery it may be brought to your room.                  Please read over the following fact sheets you were given: _____________________________________________________________________             [facility] - Preparing for Surgery Before surgery, you can play an important role.  Because skin is not sterile, your skin needs to be as free of germs as possible.  You can reduce the number of germs on your skin by washing with CHG (chlorahexidine gluconate) soap before surgery.  CHG is an antiseptic cleaner which kills germs and bonds with the skin to continue killing germs even after washing. Please DO NOT use if you have an allergy to CHG or antibacterial soaps.  If your skin becomes reddened/irritated stop using the CHG and inform your nurse when you arrive at Short Stay. Do not shave (including legs and underarms) for at least 48 hours prior to the first CHG shower.  You may shave your face/neck. Please follow these instructions carefully:  1.  Shower with CHG Soap the night before surgery and the  morning of Surgery.  2.  If you choose to wash your hair, wash your hair first as usual with your  normal  shampoo.  3.  After you shampoo, rinse your hair and body thoroughly to remove the  shampoo.                           4.  Use CHG as you would any other liquid soap.  You can apply chg directly  to the skin and wash                       Gently with a scrungie or clean washcloth.  5.  Apply the CHG Soap to your body ONLY FROM  THE NECK DOWN.   Do not use on face/ open                           Wound or open sores. Avoid contact with eyes, ears mouth and genitals (private parts).                       Wash face,  Genitals (private parts) with your normal soap.             6.  Wash thoroughly, paying special attention to the area where your surgery  will be performed.  7.  Thoroughly rinse your body with warm water from the neck down.  8.  DO NOT shower/wash with your normal soap after using and rinsing off  the CHG Soap.                9.  Pat yourself dry with a clean towel.            10.  Wear clean pajamas.            11.  Place clean sheets on your bed the night of your first shower and do not  sleep with pets. Day of Surgery : Do not apply any lotions/deodorants the morning of surgery.  Please wear clean clothes to the hospital/surgery center.  FAILURE TO FOLLOW THESE INSTRUCTIONS MAY RESULT IN THE CANCELLATION OF YOUR SURGERY PATIENT SIGNATURE_________________________________  NURSE SIGNATURE__________________________________  ________________________________________________________________________   Marie Maynard  An incentive spirometer is a tool that can help keep your lungs clear and active. This tool measures how well you are filling your lungs with each breath. Taking long deep breaths may help reverse or decrease the chance of developing breathing (pulmonary) problems (especially infection) following:  A long period of time when you are unable to move or be active. BEFORE THE PROCEDURE   If the spirometer includes an indicator to show your best effort, your nurse or respiratory therapist will set it to a desired goal.  If possible, sit up straight or lean slightly forward. Try not to slouch.  Hold the incentive spirometer in an upright position. INSTRUCTIONS FOR USE  1. Sit on the edge of your bed if possible, or sit up as far as you can in bed or on a chair. 2. Hold the incentive  spirometer in an upright position. 3. Breathe out normally. 4. Place the mouthpiece in your mouth and seal your lips tightly around it. 5. Breathe in slowly and as deeply as possible, raising the piston or the ball toward the top of the column. 6. Hold your breath for 3-5 seconds or for as long as possible. Allow the piston or ball to fall to the bottom of the column. 7. Remove the mouthpiece from your mouth and breathe out normally. 8. Rest for a few seconds and repeat Steps 1 through 7 at least 10 times  every 1-2 hours when you are awake. Take your time and take a few normal breaths between deep breaths. 9. The spirometer may include an indicator to show your best effort. Use the indicator as a goal to work toward during each repetition. 10. After each set of 10 deep breaths, practice coughing to be sure your lungs are clear. If you have an incision (the cut made at the time of surgery), support your incision when coughing by placing a pillow or rolled up towels firmly against it. Once you are able to get out of bed, walk around indoors and cough well. You may stop using the incentive spirometer when instructed by your caregiver.  RISKS AND COMPLICATIONS  Take your time so you do not get dizzy or light-headed.  If you are in pain, you may need to take or ask for pain medication before doing incentive spirometry. It is harder to take a deep breath if you are having pain. AFTER USE  Rest and breathe slowly and easily.  It can be helpful to keep track of a log of your progress. Your caregiver can provide you with a simple table to help with this. If you are using the spirometer at home, follow these instructions: [place] IF:   You are having difficultly using the spirometer.  You have trouble using the spirometer as often as instructed.  Your pain medication is not giving enough relief while using the spirometer.  You develop fever of 100.5 F (38.1 C) or higher. SEEK  IMMEDIATE MEDICAL CARE IF:   You cough up bloody sputum that had not been present before.  You develop fever of 102 F (38.9 C) or greater.  You develop worsening pain at or near the incision site. MAKE SURE YOU:   Understand these instructions.  Will watch your condition.  Will get help right away if you are not doing well or get worse. Document Released: 07/14/2006 Document Revised: 05/26/2011 Document Reviewed: 09/14/2006 ExitCare Patient Information 2014 ExitCare, [place].   ________________________________________________________________________  WHAT IS A BLOOD TRANSFUSION? Blood Transfusion Information  A transfusion is the replacement of blood or some of its parts. Blood is made up of multiple cells which provide different functions.  Red blood cells carry oxygen and are used for blood loss replacement.  White blood cells fight against infection.  Platelets control bleeding.  Plasma helps clot blood.  Other blood products are available for specialized needs, such as hemophilia or other clotting disorders. BEFORE THE TRANSFUSION  Who gives blood for transfusions?   Healthy volunteers who are fully evaluated to make sure their blood is safe. This is blood bank blood. Transfusion therapy is the safest it has ever been in the practice of medicine. Before blood is taken from a donor, a complete history is taken to make sure that person has no history of diseases nor engages in risky social behavior (examples are intravenous drug use or sexual activity with multiple partners). The donor's travel history is screened to minimize risk of transmitting infections, such as malaria. The donated blood is tested for signs of infectious diseases, such as HIV and hepatitis. The blood is then tested to be sure it is compatible with you in order to minimize the chance of a transfusion reaction. If you or a relative donates blood, this is often done in anticipation of surgery and is not  appropriate for emergency situations. It takes many days to process the donated blood. RISKS AND COMPLICATIONS Although transfusion therapy is very  safe and saves many lives, the main dangers of transfusion include:   Getting an infectious disease.  Developing a transfusion reaction. This is an allergic reaction to something in the blood you were given. Every precaution is taken to prevent this. The decision to have a blood transfusion has been considered carefully by your caregiver before blood is given. Blood is not given unless the benefits outweigh the risks. AFTER THE TRANSFUSION  Right after receiving a blood transfusion, you will usually feel much better and more energetic. This is especially true if your red blood cells have gotten low (anemic). The transfusion raises the level of the red blood cells which carry oxygen, and this usually causes an energy increase.  The nurse administering the transfusion will monitor you carefully for complications. HOME CARE INSTRUCTIONS  No special instructions are needed after a transfusion. You may find your energy is better. Speak with your caregiver about any limitations on activity for underlying diseases you may have. SEEK MEDICAL CARE IF:   Your condition is not improving after your transfusion.  You develop redness or irritation at the intravenous (IV) site. SEEK IMMEDIATE MEDICAL CARE IF:  Any of the following symptoms occur over the next 12 hours:  Shaking chills.  You have a temperature by mouth above 102 F (38.9 C), not controlled by medicine.  Chest, back, or muscle pain.  People around you feel you are not acting correctly or are confused.  Shortness of breath or difficulty breathing.  Dizziness and fainting.  You get a rash or develop hives.  You have a decrease in urine output.  Your urine turns a dark color or changes to pink, red, or brown. Any of the following symptoms occur over the next 10 days:  You have a  temperature by mouth above 102 F (38.9 C), not controlled by medicine.  Shortness of breath.  Weakness after normal activity.  The white part of the eye turns yellow (jaundice).  You have a decrease in the amount of urine or are urinating less often.  Your urine turns a dark color or changes to pink, red, or brown. Document Released: 02/29/2000 Document Revised: 05/26/2011 Document Reviewed: 10/18/2007 [facility] Patient Information 2014 [place], [place].  _______________________________________________________________________

## 2016-04-29 ENCOUNTER — Encounter (HOSPITAL_COMMUNITY)
Admission: RE | Admit: 2016-04-29 | Discharge: 2016-04-29 | Disposition: A | Discharge status: Home / Self Care | Payer: Medicare Other | Source: Ambulatory Visit | Attending: Orthopedic Surgery | Admitting: Orthopedic Surgery

## 2016-04-29 ENCOUNTER — Encounter (HOSPITAL_COMMUNITY): Payer: Self-pay | Admitting: *Deleted

## 2016-04-29 DIAGNOSIS — M1711 Unilateral primary osteoarthritis, right knee: Secondary | ICD-10-CM | POA: Insufficient documentation

## 2016-04-29 DIAGNOSIS — Z01818 Encounter for other preprocedural examination: Secondary | ICD-10-CM | POA: Diagnosis not present

## 2016-04-29 HISTORY — DX: Unspecified osteoarthritis, unspecified site: M19.90

## 2016-04-29 HISTORY — DX: Adverse effect of unspecified anesthetic, initial encounter: T41.45XA

## 2016-04-29 HISTORY — DX: Anxiety disorder, unspecified: F41.9

## 2016-04-29 HISTORY — DX: Headache: R51

## 2016-04-29 HISTORY — DX: Headache, unspecified: R51.9

## 2016-04-29 HISTORY — DX: Allergy status to unspecified drugs, medicaments and biological substances: Z88.9

## 2016-04-29 LAB — URINALYSIS, ROUTINE W REFLEX MICROSCOPIC
Bacteria, UA: NONE SEEN
Bilirubin Urine: NEGATIVE
Glucose, UA: NEGATIVE mg/dL
Hgb urine dipstick: NEGATIVE
Ketones, ur: NEGATIVE mg/dL
Nitrite: NEGATIVE
PH: 5 (ref 5.0–8.0)
Protein, ur: NEGATIVE mg/dL
SPECIFIC GRAVITY, URINE: 1.01 (ref 1.005–1.030)

## 2016-04-29 LAB — COMPREHENSIVE METABOLIC PANEL
ALBUMIN: 4.3 g/dL (ref 3.5–5.0)
ALK PHOS: 58 U/L (ref 38–126)
ALT: 17 U/L (ref 14–54)
ANION GAP: 7 (ref 5–15)
AST: 24 U/L (ref 15–41)
BUN: 17 mg/dL (ref 6–20)
CALCIUM: 9.4 mg/dL (ref 8.9–10.3)
CO2: 29 mmol/L (ref 22–32)
Chloride: 103 mmol/L (ref 101–111)
Creatinine, Ser: 0.7 mg/dL (ref 0.44–1.00)
GFR calc Af Amer: >60 mL/min (ref >60)
GFR calc non Af Amer: >60 mL/min (ref >60)
GLUCOSE: 90 mg/dL (ref 65–99)
Potassium: 4.2 mmol/L (ref 3.5–5.1)
SODIUM: 139 mmol/L (ref 135–145)
Total Bilirubin: 0.9 mg/dL (ref 0.3–1.2)
Total Protein: 6.8 g/dL (ref 6.5–8.1)

## 2016-04-29 LAB — SURGICAL PCR SCREEN
MRSA, PCR: NEGATIVE
STAPHYLOCOCCUS AUREUS: NEGATIVE

## 2016-04-29 LAB — ABO/RH: ABO/RH(D): A POS

## 2016-04-29 LAB — CBC
HCT: 41.1 % (ref 36.0–46.0)
HEMOGLOBIN: 13.9 g/dL (ref 12.0–15.0)
MCH: 28.5 pg (ref 26.0–34.0)
MCHC: 33.8 g/dL (ref 30.0–36.0)
MCV: 84.2 fL (ref 78.0–100.0)
Platelets: 186 10*3/uL (ref 150–400)
RBC: 4.88 MIL/uL (ref 3.87–5.11)
RDW: 13.9 % (ref 11.5–15.5)
WBC: 5.2 10*3/uL (ref 4.0–10.5)

## 2016-04-29 LAB — PROTIME-INR
INR: 1.02
Prothrombin Time: 13.4 seconds (ref 11.4–15.2)

## 2016-04-29 LAB — APTT: APTT: 30 s (ref 24–36)

## 2016-04-30 NOTE — Progress Notes (Signed)
Patient notified of time change for surgery on Monday 05/05/2016 . Patient instructed to arrive at Short Stay at [facility] at [phone] am  and nothing to eat or drink after midnight! Patient verbalized understanding.

## 2016-05-01 ENCOUNTER — Ambulatory Visit: Payer: Self-pay | Admitting: Orthopedic Surgery

## 2016-05-01 NOTE — Progress Notes (Signed)
03/07/2016- Pre-operative clearance with office note from Dr. Harlan Stains on chart.

## 2016-05-01 NOTE — H&P (Signed)
Marie Maynard DOB: 1951/03/16 Married / Language: English / Race: White Female Date of Admission:  05/05/2016 CC:  Right Knee Pain History of Present Illness  The patient is a 66 year old female who comes in for a preoperative History and Physical. The patient is scheduled for a right total knee arthroplasty to be performed by Dr. Dione Plover. Aluisio, MD at [facility] on 05-05-2016. The patient is a 66 year old female who was seen months out from right knee arthroscopy (w/ LMD and condroplasty. DOS: 10/31/15). The patient stated that she is slightly worse at this time. They currently describe their pain as mild to moderate.The patient states that the swelling is moderate at this time. She had an aspiration and cortisone injection 13days ago. Unfortunately, the right knee continues to worsen. She was seen recently and obtained an x-ray. Unfortunatley, she has gone on to progress to bone-on-bone in her lateral compartment. She is having pain with all activities. She is getting recurrent swelling. Cortisone has not helped her at all. We had discussed viscosupplement injections with her, but she is not too keen on those. She is looking for a more permanent solution to her knee problem. She is ready to proceed with surgery at this time. It is felt that she would benefit from undergoing a total knee replacement. They have been treated conservatively in the past for the above stated problem and despite conservative measures, they continue to have progressive pain and severe functional limitations and dysfunction. They have failed non-operative management including home exercise, medications, and injections. It is felt that they would benefit from undergoing total joint replacement. Risks and benefits of the procedure have been discussed with the patient and they elect to proceed with surgery. There are no active contraindications to surgery such as ongoing infection or rapidly progressive  neurological disease.  Problem List/Past Medical Primary osteoarthritis of right knee (M17.11)  Derangement of posterior horn of lateral meniscus due to old tear or injury, left knee (M23.252)  Anxiety Disorder  Chronic Cystitis  Chronic Pain  Hypothyroidism  Skin Cancer  Impaired Vision  Shingles  Hypercholesterolemia  Irritable bowel syndrome  Urinary Tract Infection  Degenerative Disc Disease  Menopause  Measles  Mumps  Diverticulosis  Allergic Rhinitis  Insomnia  Possible Patent Foramen Ovale per ECHO 12/15  Lumbar Spinal Stenosis  Vitamin D Deficiency   Allergies  OxyCODONE HCl *ANALGESICS - OPIOID*  Levaquin *Fluoroquinolones**  Tylenol with Codeine #3 *ANALGESICS - OPIOID*  PredniSONE *CORTICOSTEROIDS*  Neurontin *ANTICONVULSANTS*  Morphine Sulfate *ANALGESICS - OPIOID*  CeleBREX *ANALGESICS - ANTI-INFLAMMATORY*   Family History  Cancer  mother and father Chronic Obstructive Lung Disease  mother Heart Disease  brother Kidney disease  child Severe allergy  mother  Social History Alcohol use  never consumed alcohol Children  2 Current work status  working part time Engineer, agricultural (Currently)  no Drug/Alcohol Rehab (Previously)  no Exercise  Exercises rarely; does other Illicit drug use  no Living situation  live with spouse Marital status  married Number of flights of stairs before winded  2-3 Pain Contract  no Tobacco / smoke exposure  no Tobacco use  never smoker  Medication History Nasonex (50MCG/ACT Suspension, Nasal) Active. Lyrica (25MG  Capsule, Oral) Active. Sertraline HCl (100MG  Tablet, Oral) Active. Levothyroxine Sodium (50MCG Tablet, Oral) Active. Biotin Active. Vitamin D Active. Calcium/Magnesium/Zinc Active. Vitamin E Active. Turmeric Active. Cinnamon Active. Red Yeast Rice Active.   Past Surgical History Appendectomy  Date: 42. Breast Biopsy  Date: 57.  left Carpal Tunnel Repair  Date: 2006. left Colon Polyp Removal - Colonoscopy  Date: 2009. Gallbladder Surgery  Date: 2005. laporoscopic Hysterectomy  Date: 38. partial (non-cancerous) Thyroidectomy; Subtotal  left, right Tonsillectomy  Date: 30. Tubal Ligation  Cystocele  Date: 2009. Radio-frequency Ablation Procedure  Date: 2016. Meniscal Surgery  Date: 2017.   Review of Systems  General Not Present- Chills, Fatigue, Fever, Memory Loss, Night Sweats, Weight Gain and Weight Loss. Skin Not Present- Eczema, Hives, Itching, Lesions and Rash. HEENT Not Present- Dentures, Double Vision, Headache, Hearing Loss, Tinnitus and Visual Loss. Respiratory Not Present- Allergies, Chronic Cough, Coughing up blood, Shortness of breath at rest and Shortness of breath with exertion. Cardiovascular Not Present- Chest Pain, Difficulty Breathing Lying Down, Murmur, Palpitations, Racing/skipping heartbeats and Swelling. Gastrointestinal Not Present- Abdominal Pain, Bloody Stool, Constipation, Diarrhea, Difficulty Swallowing, Heartburn, Jaundice, Loss of appetitie, Nausea and Vomiting. Female Genitourinary Not Present- Blood in Urine, Discharge, Flank Pain, Incontinence, Painful Urination, Urgency, Urinary frequency, Urinary Retention, Urinating at Night and Weak urinary stream. Musculoskeletal Present- Joint Swelling and Morning Stiffness. Not Present- Back Pain, Joint Pain, Muscle Pain, Muscle Weakness and Spasms. Neurological Not Present- Blackout spells, Difficulty with balance, Dizziness, Paralysis, Tremor and Weakness. Psychiatric Present- Insomnia.  Vitals Weight: 155 lb Height: 64in Weight was reported by patient. Height was reported by patient. Body Surface Area: 1.76 m Body Mass Index: 26.61 kg/m  Pulse: 72 (Regular)  BP: 122/68 (Sitting, Right Arm, Standard)  Physical Exam General Mental Status -Alert, cooperative and good historian. General Appearance-pleasant,  Not in acute distress. Orientation-Oriented X3. Build & Nutrition-Well nourished and Well developed.  Head and Neck Head-normocephalic, atraumatic . Neck Global Assessment - supple, no bruit auscultated on the right, no bruit auscultated on the left.  Eye Vision-Wears corrective lenses. Pupil - Bilateral-Regular and Round. Motion - Bilateral-EOMI.  Chest and Lung Exam Auscultation Breath sounds - clear at anterior chest wall and clear at posterior chest wall. Adventitious sounds - No Adventitious sounds.  Cardiovascular Auscultation Rhythm - Regular rate and rhythm. Heart Sounds - S1 WNL and S2 WNL. Murmurs & Other Heart Sounds - Auscultation of the heart reveals - No Murmurs.  Abdomen Palpation/Percussion Tenderness - Abdomen is non-tender to palpation. Rigidity (guarding) - Abdomen is soft. Auscultation Auscultation of the abdomen reveals - Bowel sounds normal.  Female Genitourinary Note: Not done, not pertinent to present illness   Musculoskeletal Note: On exam, she is alert and oriented, in no apparent distress. Her right knee shows a slight effusion. Her range of motion is about 5 to 130. She is very tender laterally. There is no medial tenderness. There is no instability noted.  I reviewed those radiographs from last visit. She has progressed to bone-on-bone in the lateral compartment. She also has patellofemoral degeneration.   Assessment & Plan Primary osteoarthritis of right knee (M17.11)  Note:Surgical Plans: Right Total Knee Replacement  Disposition: Home with Husband  PCP: Dr. Dema Severin - Patient has been seen preoperatively and felt to be stable for surgery.  IV TXA  Anesthesia Issues: Very slow to wake up after her scope procedure.  Patient was instructed on what medications to stop prior to surgery.  Signed electronically by Ok Edwards, III PA-C

## 2016-05-05 LAB — TYPE AND SCREEN
ABO/RH(D): A POS
ANTIBODY SCREEN: NEGATIVE

## 2016-05-06 ENCOUNTER — Other Ambulatory Visit: Payer: Self-pay | Admitting: Family Medicine

## 2016-05-06 DIAGNOSIS — R1030 Lower abdominal pain, unspecified: Secondary | ICD-10-CM

## 2016-05-07 ENCOUNTER — Ambulatory Visit
Admission: RE | Admit: 2016-05-07 | Discharge: 2016-05-07 | Disposition: A | Discharge status: Home / Self Care | Payer: Medicare Other | Source: Ambulatory Visit | Attending: Family Medicine | Admitting: Family Medicine

## 2016-05-07 DIAGNOSIS — R1030 Lower abdominal pain, unspecified: Secondary | ICD-10-CM

## 2016-05-07 MED ORDER — IOHEXOL 300 MG/ML  SOLN
30.0000 mL | Freq: Once | INTRAMUSCULAR | Status: AC | PRN
  Administered 2016-05-07: 30 mL via ORAL

## 2016-05-07 MED ORDER — IOPAMIDOL (ISOVUE-300) INJECTION 61%
100.0000 mL | Freq: Once | INTRAVENOUS | Status: AC | PRN
  Administered 2016-05-07: 100 mL via INTRAVENOUS

## 2016-05-08 ENCOUNTER — Ambulatory Visit: Payer: Self-pay | Admitting: Orthopedic Surgery

## 2016-05-08 NOTE — Progress Notes (Signed)
Called patient and LVMM with new date, time and arrival time of surgery.  Asked patient to call back to [phone] to review preop instructions.

## 2016-05-26 ENCOUNTER — Encounter (HOSPITAL_COMMUNITY): Admission: RE | Payer: Self-pay | Source: Ambulatory Visit

## 2016-05-26 ENCOUNTER — Inpatient Hospital Stay (HOSPITAL_COMMUNITY): Admission: RE | Admit: 2016-05-26 | Payer: Medicare Other | Source: Ambulatory Visit | Admitting: Orthopedic Surgery

## 2016-05-26 SURGERY — ARTHROPLASTY, KNEE, TOTAL
Anesthesia: Choice | Site: Knee | Laterality: Right

## 2016-08-14 DIAGNOSIS — N390 Urinary tract infection, site not specified: Secondary | ICD-10-CM | POA: Insufficient documentation

## 2016-08-19 ENCOUNTER — Ambulatory Visit: Payer: Medicare Other | Admitting: Internal Medicine

## 2017-04-07 ENCOUNTER — Ambulatory Visit: Payer: Medicare Other | Admitting: Podiatry

## 2017-04-07 ENCOUNTER — Encounter: Payer: Self-pay | Admitting: Podiatry

## 2017-04-07 ENCOUNTER — Ambulatory Visit (INDEPENDENT_AMBULATORY_CARE_PROVIDER_SITE_OTHER): Payer: Medicare Other

## 2017-04-07 VITALS — BP 100/61 | HR 71 | Ht 64.0 in | Wt 145.0 lb

## 2017-04-07 DIAGNOSIS — M722 Plantar fascial fibromatosis: Secondary | ICD-10-CM

## 2017-04-07 NOTE — Patient Instructions (Signed)

## 2017-04-07 NOTE — Progress Notes (Signed)
Subjective:  Patient ID: Marie Maynard, female    DOB: 1950-04-09,  MRN: [id number]  Chief Complaint  Patient presents with  . Foot Pain    left heel pain  hx of Pf    67 y.o. female presents with the above complaint.  History of plantar fasciitis to both feet.  Former patient of Dr. Amalia Hailey Dr. Blenda Mounts.  Unsure if she pulled something in her left heel that started her pain.  Past Medical History:  Diagnosis Date  . Anxiety    post-menopausal   . Arthritis   . 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 Complication of anesthesia 10/31/2015   patient states received too much Phenergan during surgery and recovery and very hard to wake up! Does not want any Phenergan.  . H/O seasonal allergies   . Headache   . Hypothyroidism    Past Surgical History:  Procedure Laterality Date  . ABDOMINAL HYSTERECTOMY     partial   . APPENDECTOMY    . BREAST BIOPSY     left   . CARPAL TUNNEL RELEASE     left   . CHOLECYSTECTOMY  2005  . CYSTOCELE REPAIR    . goiter surgery  2005  . jaw arthrocentesis    . KNEE ARTHROSCOPY WITH LATERAL MENISECTOMY Right 10/31/2015   Procedure: RIGHT KNEE ARTHROSCOPY WITH MENISCAL DEBRIDEMENT;  Surgeon: Gaynelle Arabian, MD;  Location: [facility];  Service: Orthopedics;  Laterality: Right;  . lumbar injections      x 2  . spinal radiofrequency ablation      x 3  . TONSILLECTOMY      Current Outpatient Medications:  .  ibuprofen (ADVIL,MOTRIN) 200 MG tablet, Take 400 mg by mouth every 4 (four) hours as needed (for pain.)., Disp: , Rfl:  .  levothyroxine (SYNTHROID, LEVOTHROID) 50 MCG tablet, Take 50 mcg by mouth daily before breakfast., Disp: , Rfl:  .  mometasone (NASONEX) 50 MCG/ACT nasal spray, Place 2 sprays into the nose daily as needed (for allergies). , Disp: , Rfl:  .  nitrofurantoin (MACRODANTIN) 100 MG capsule, Take 100 mg by mouth daily as needed (for bladder infections). For bladder , Disp: , Rfl:  .  pregabalin (LYRICA) 50 MG  capsule, Take 50 mg by mouth at bedtime. , Disp: , Rfl:  .  sertraline (ZOLOFT) 100 MG tablet, Take 100 mg by mouth daily., Disp: , Rfl:  .  Calcium-Magnesium-Zinc (CAL-MAG-ZINC PO), Take 1 tablet by mouth daily., Disp: , Rfl:  .  cholecalciferol (VITAMIN D) 1000 units tablet, Take 1,000 Units by mouth daily., Disp: , Rfl:  .  Cholecalciferol (VITAMIN D3) 5000 units TABS, Take 5,000 Units by mouth daily., Disp: , Rfl:  .  CINNAMON PO, Take 1 capsule by mouth 2 (two) times daily., Disp: , Rfl:  .  HYDROmorphone (DILAUDID) 2 MG tablet, Take 1-2 tablets (2-4 mg total) by mouth every 4 (four) hours as needed for severe pain. (Patient not taking: Reported on 04/24/2016), Disp: 40 tablet, Rfl: 0 .  methocarbamol (ROBAXIN) 500 MG tablet, Take 1 tablet (500 mg total) by mouth 4 (four) times daily. As needed for muscle spasm (Patient not taking: Reported on 04/24/2016), Disp: 30 tablet, Rfl: 1 .  Multiple Vitamin (MULTIVITAMIN WITH MINERALS) TABS tablet, Take 1 tablet by mouth daily., Disp: , Rfl:  .  naproxen sodium (ANAPROX) 220 MG tablet, Take 220-440 mg by mouth 2 (two) times daily as needed (for pain.)., Disp: ,  Rfl:  .  ondansetron (ZOFRAN) 4 MG tablet, Take 1 tablet (4 mg total) by mouth every 8 (eight) hours as needed for nausea or vomiting. (Patient not taking: Reported on 04/24/2016), Disp: 20 tablet, Rfl: 0 .  RED YEAST RICE EXTRACT PO, Take 1 capsule by mouth daily., Disp: , Rfl:  .  TURMERIC PO, Take 1 capsule by mouth daily., Disp: , Rfl:  .  vitamin E 400 UNIT capsule, Take 400 Units by mouth daily., Disp: , Rfl:   Allergies  Allergen Reactions  . Codeine Itching  . Neurontin [Gabapentin] Other (See Comments)    Seeing double  . Oxycodone Itching  . Prednisone Anxiety  . Celebrex [Celecoxib]     Nausea and stomach pain   . Ciprofloxacin Diarrhea    Leads to c-diff  . Levaquin [Levofloxacin] Itching and Other (See Comments)    Feeling tired  . Morphine And Related     Nausea and stomach  pain   . Promethazine Other (See Comments)    Can't wake up  . Sulfamethoxazole-Trimethoprim Diarrhea    Leads to c-diff  . Tylenol With Codeine #3 [Acetaminophen-Codeine]     Skin itching, and Anxiety   . Other Rash    Uncoded Allergy. Allergen: CHLORINE   Review of Systems Objective:   Vitals:   04/07/17 1548  BP: 100/61  Pulse: 71   General AA&O x3. Normal mood and affect.  Vascular Dorsalis pedis and posterior tibial pulses  present 2+ bilaterally  Capillary refill normal to all digits. Pedal hair growth normal.  Neurologic Epicritic sensation grossly present bilaterally.  Dermatologic No open lesions. Interspaces clear of maceration. Nails well groomed and normal in appearance.  Orthopedic: MMT 5/5 in dorsiflexion, plantarflexion, inversion, and eversion bilaterally. Tender to palpation at the calcaneal tuber left. No pain with calcaneal squeeze left. Ankle ROM diminished range of motion left. Silfverskiold Test: positive left.   Radiographs: Taken and reviewed. No acute fractures. No evidence of calcaneal stress fracture. Plantar and posterior spurring noted.  Assessment & Plan:  Patient was evaluated and treated and all questions answered.  Plantar Fasciitis, left - XR reviewed as above.  - Educated on icing and stretching. Instructions given.  - Injection delivered to the plantar fascia as below. - Plantar fascial rest strap applied. - Offered rx for NSAID. Declined.  Procedure: Injection Tendon/Ligament Location: Left plantar fascia at the glabrous junction; medial approach. Skin Prep: Alcohol. Injectate: 1 cc 0.5% marcaine plain, 1 cc dexamethasone phosphate, 0.5 cc kenalog 10. Disposition: Patient tolerated procedure well. Injection site dressed with a band-aid.   No Follow-up on file.

## 2017-04-28 ENCOUNTER — Ambulatory Visit (INDEPENDENT_AMBULATORY_CARE_PROVIDER_SITE_OTHER): Payer: Medicare Other | Admitting: Podiatry

## 2017-04-28 DIAGNOSIS — M722 Plantar fascial fibromatosis: Secondary | ICD-10-CM

## 2017-05-06 NOTE — Progress Notes (Signed)
  Subjective:  Patient ID: Marie Maynard, female    DOB: 07-May-1950,  MRN: [id number]  Chief Complaint  Patient presents with  . Plantar Fasciitis    3 wk fu left heel some better but had a reaction to the tape skin turned red irritated and now scaling    67 y.o. female returns for the above complaint.  States the heel is still hurting the mother  Objective:  There were no vitals filed for this visit. General AA&O x3. Normal mood and affect.  Vascular Pedal pulses palpable.  Neurologic Epicritic sensation grossly intact.  Dermatologic No open lesions. Skin normal texture and turgor.  Orthopedic:  Pain to palpation of the left medial heel tuber   Assessment & Plan:  Patient was evaluated and treated and all questions answered.  Plantar Fasciitis, Improving -Continue stretching  F/u in 3 weeks for recheck.  No Follow-up on file.

## 2017-05-19 ENCOUNTER — Ambulatory Visit: Payer: Medicare Other | Admitting: Podiatry

## 2017-10-02 ENCOUNTER — Other Ambulatory Visit: Payer: Self-pay | Admitting: Rehabilitation

## 2017-10-02 DIAGNOSIS — M47819 Spondylosis without myelopathy or radiculopathy, site unspecified: Secondary | ICD-10-CM

## 2017-10-13 ENCOUNTER — Ambulatory Visit
Admission: RE | Admit: 2017-10-13 | Discharge: 2017-10-13 | Disposition: A | Discharge status: Home / Self Care | Payer: Medicare Other | Source: Ambulatory Visit | Attending: Rehabilitation | Admitting: Rehabilitation

## 2017-10-13 ENCOUNTER — Encounter (INDEPENDENT_AMBULATORY_CARE_PROVIDER_SITE_OTHER): Payer: Self-pay

## 2017-10-13 DIAGNOSIS — M47819 Spondylosis without myelopathy or radiculopathy, site unspecified: Secondary | ICD-10-CM

## 2018-02-26 ENCOUNTER — Other Ambulatory Visit: Payer: Self-pay | Admitting: Family Medicine

## 2018-02-26 DIAGNOSIS — Z1231 Encounter for screening mammogram for malignant neoplasm of breast: Secondary | ICD-10-CM

## 2018-03-01 ENCOUNTER — Other Ambulatory Visit: Payer: Self-pay | Admitting: Family Medicine

## 2018-03-01 DIAGNOSIS — N644 Mastodynia: Secondary | ICD-10-CM

## 2018-03-11 ENCOUNTER — Ambulatory Visit
Admission: RE | Admit: 2018-03-11 | Discharge: 2018-03-11 | Disposition: A | Discharge status: Home / Self Care | Payer: Medicare Other | Source: Ambulatory Visit | Attending: Family Medicine | Admitting: Family Medicine

## 2018-03-11 DIAGNOSIS — N644 Mastodynia: Secondary | ICD-10-CM

## 2018-03-31 DIAGNOSIS — M1710 Unilateral primary osteoarthritis, unspecified knee: Secondary | ICD-10-CM | POA: Insufficient documentation

## 2018-03-31 DIAGNOSIS — M179 Osteoarthritis of knee, unspecified: Secondary | ICD-10-CM | POA: Insufficient documentation

## 2018-04-09 ENCOUNTER — Other Ambulatory Visit: Payer: Self-pay | Admitting: Obstetrics and Gynecology

## 2018-04-09 DIAGNOSIS — E2839 Other primary ovarian failure: Secondary | ICD-10-CM

## 2018-04-12 ENCOUNTER — Other Ambulatory Visit: Payer: Self-pay | Admitting: Obstetrics and Gynecology

## 2018-04-12 DIAGNOSIS — N951 Menopausal and female climacteric states: Secondary | ICD-10-CM

## 2018-04-12 DIAGNOSIS — E2839 Other primary ovarian failure: Secondary | ICD-10-CM

## 2018-04-14 ENCOUNTER — Ambulatory Visit
Admission: RE | Admit: 2018-04-14 | Discharge: 2018-04-14 | Disposition: A | Discharge status: Home / Self Care | Payer: Medicare Other | Source: Ambulatory Visit | Attending: Obstetrics and Gynecology | Admitting: Obstetrics and Gynecology

## 2018-04-14 DIAGNOSIS — N951 Menopausal and female climacteric states: Secondary | ICD-10-CM

## 2018-04-14 DIAGNOSIS — E2839 Other primary ovarian failure: Secondary | ICD-10-CM

## 2018-06-20 IMAGING — MR MR KNEE*R* W/O CM
4 of 5 series · 31 of 40 positions shown · non-contrast
Comparison: None.

CLINICAL DATA: Lateral right knee pain.  No known injury.

EXAM:
MRI OF THE RIGHT KNEE WITHOUT CONTRAST
TECHNIQUE: Multiplanar, multisequence MR imaging of the knee was performed. No
intravenous contrast was administered.

[Series 10: PD fat-sat · axial · right · 3.0mm · 0.39mm/px · z∈[-44,+70]mm · 9 of 36 slices shown (1 of 3)]
[im 1/36]
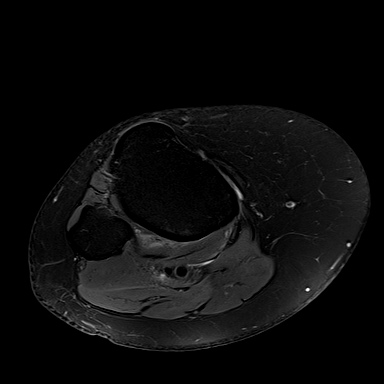
[im 5/36]
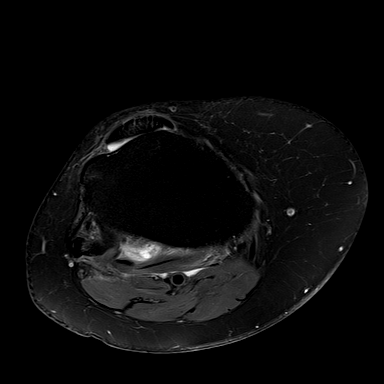
[im 9/36]
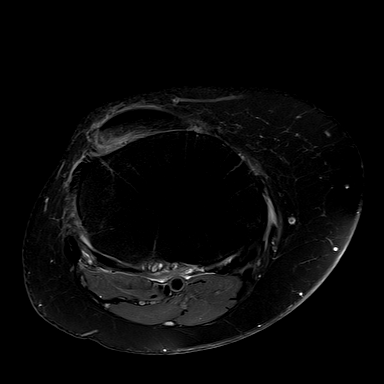
[im 14/36]
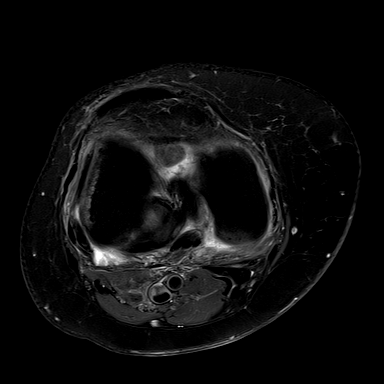
[im 18/36]
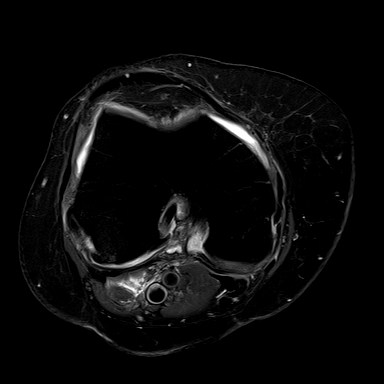
[im 22/36]
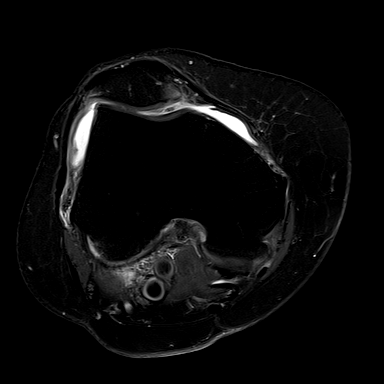
[im 27/36]
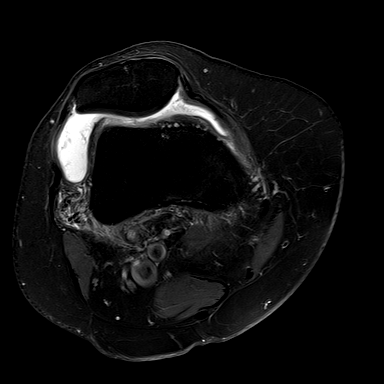
[im 31/36]
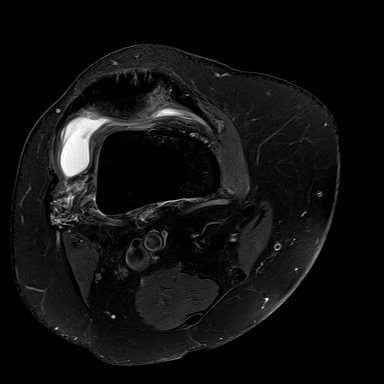
[im 36/36]
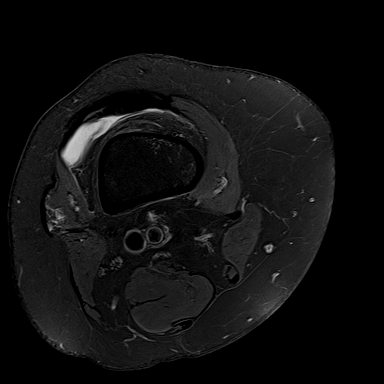

[Series 12: PD fat-sat · coronal · right · 3.0mm · 0.33mm/px · 8 of 30 slices shown (2 of 3)]
[im 1/30]
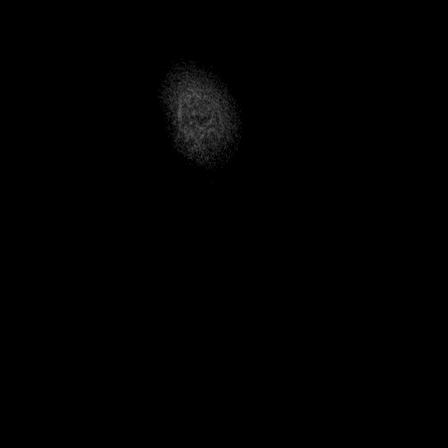
[im 5/30]
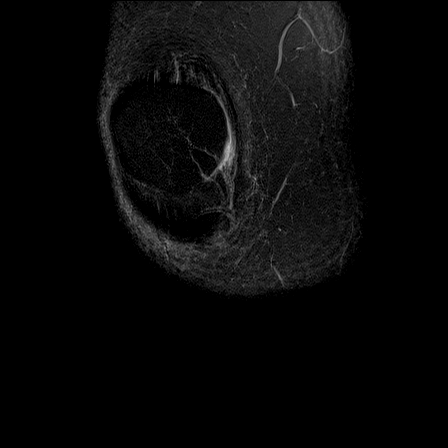
[im 9/30]
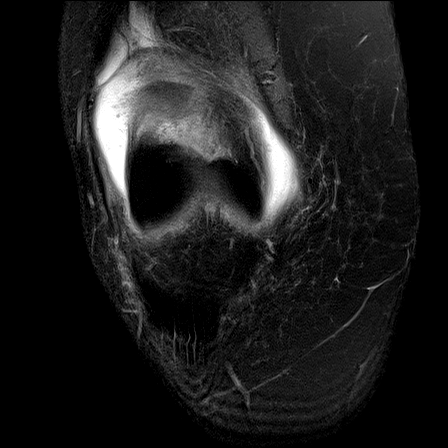
[im 13/30]
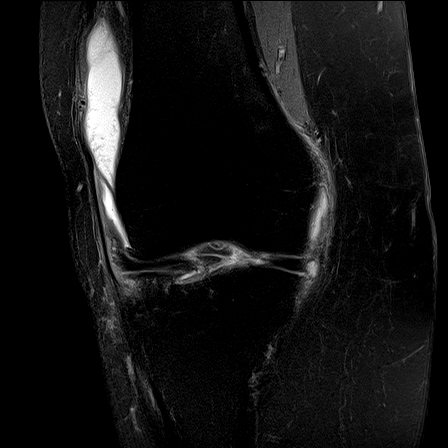
[im 17/30]
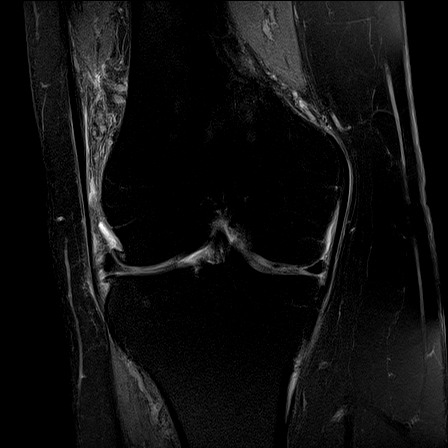
[im 21/30]
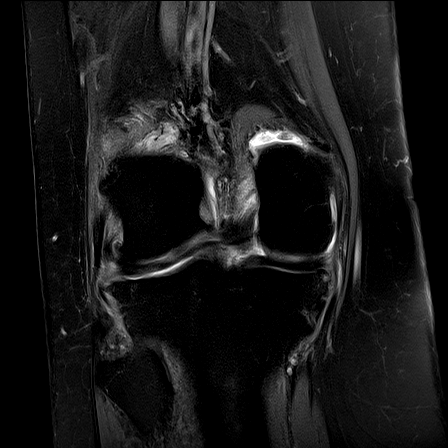
[im 25/30]
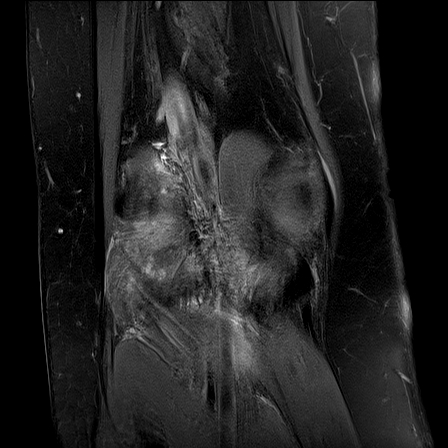
[im 30/30]
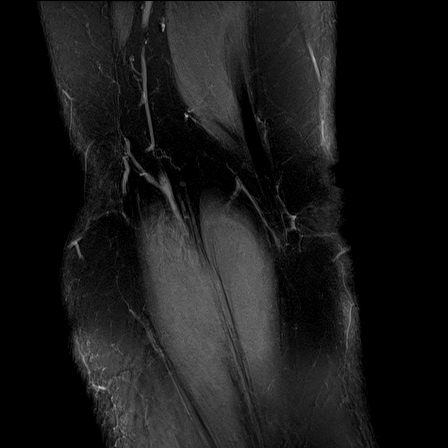

[Series 13: PD fat-sat · sagittal · right · 3.0mm · 0.39mm/px · 7 of 27 slices shown (3 of 3)]
[im 1/27]
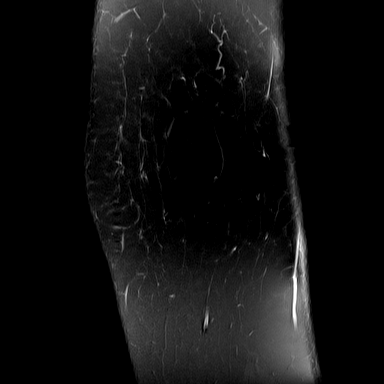
[im 5/27]
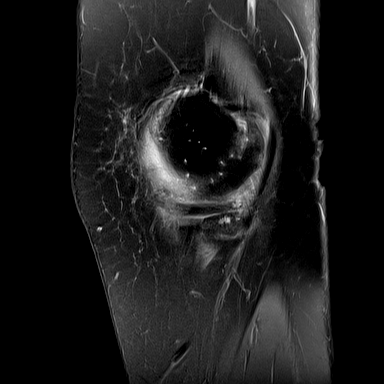
[im 9/27]
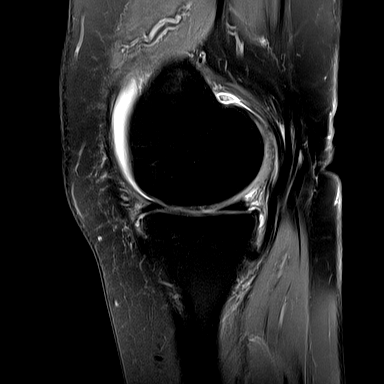
[im 14/27]
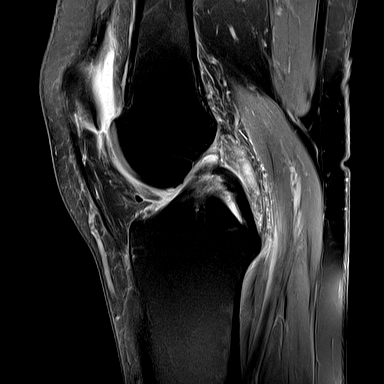
[im 18/27]
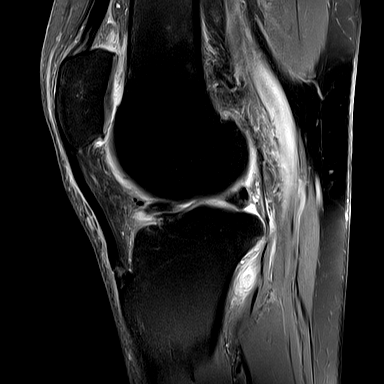
[im 22/27]
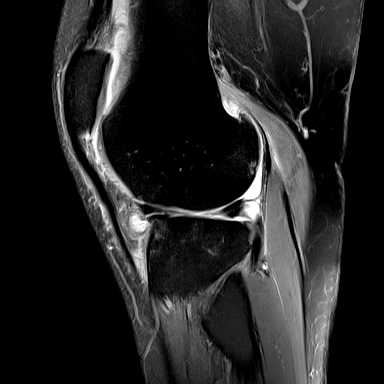
[im 27/27]
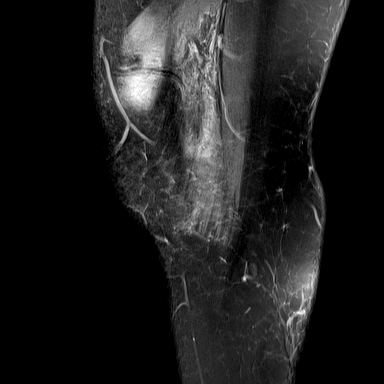

[Series 14: T2 fat-sat · coronal · right · 3.0mm · 0.39mm/px · 7 of 30 slices shown]
[im 1/30]
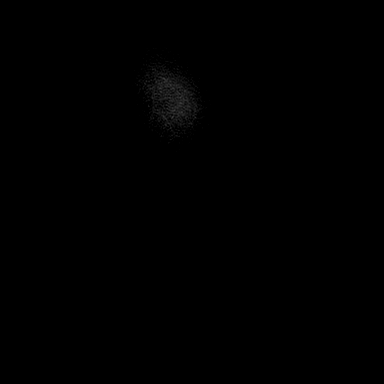
[im 5/30]
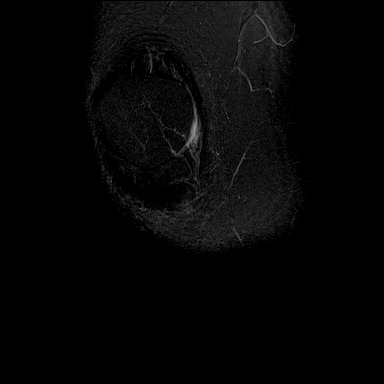
[im 9/30]
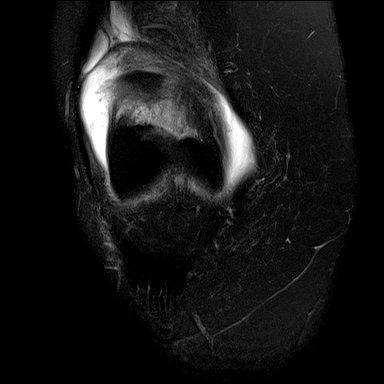
[im 13/30]
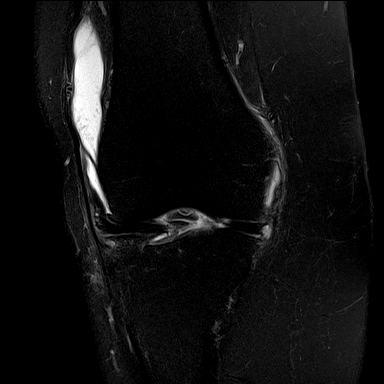
[im 17/30]
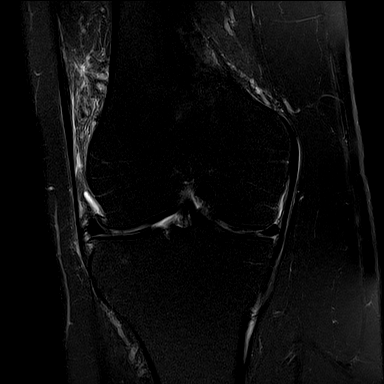
[im 21/30]
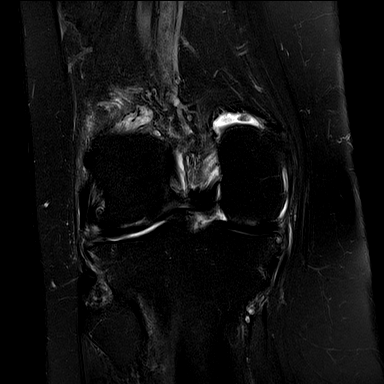
[im 25/30]
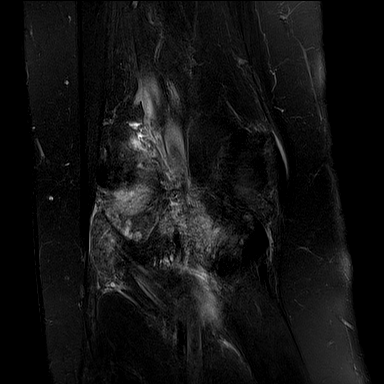

[31 of 40 positions shown; findings below may reference images not displayed]

FINDINGS: MENISCI

Medial meniscus:  Intact.

Lateral meniscus: Undersurface tear of the anterior horn of the
lateral meniscus. Oblique tear of the body of the lateral meniscus
extending to the inferior articular surface.

LIGAMENTS

Cruciates:  Intact ACL and PCL.

Collaterals: Medial collateral ligament is intact. Lateral
collateral ligament complex is intact.

CARTILAGE

Patellofemoral: Small cartilage fissure of the lateral patellar
facet. Small area of cartilage loss involving the trochlear groove.

Medial: Partial-thickness cartilage loss of the medial femorotibial
compartment.

Lateral: Partial-thickness cartilage loss of the lateral
femorotibial compartment with marginal osteophytes.

Joint: Small joint effusion. Mild synovitis. Mild edema in Hoffa's
fat. No plical thickening.

Popliteal Fossa: No Baker cyst. Intact popliteus tendon. Small
amount of fluid and synovitis in the popliteus tendon sheath.

Extensor Mechanism:  Intact.

Bones: No focal marrow signal abnormality. No acute fracture or
dislocation.

Other: None
IMPRESSION: 1. Undersurface tear of the anterior horn of the lateral meniscus.
Oblique tear of the body of the lateral meniscus extending to the
inferior articular surface.
2. Tricompartmental cartilage abnormalities as described above.
3. Small joint effusion with synovitis.

## 2018-07-05 ENCOUNTER — Ambulatory Visit: Admit: 2018-07-05 | Payer: Medicare Other | Admitting: Orthopedic Surgery

## 2018-07-05 SURGERY — ARTHROPLASTY, KNEE, TOTAL
Anesthesia: Choice | Laterality: Right

## 2018-09-12 ENCOUNTER — Other Ambulatory Visit: Payer: Self-pay | Admitting: Internal Medicine

## 2018-11-15 ENCOUNTER — Encounter: Payer: Self-pay | Admitting: Podiatry

## 2018-11-15 ENCOUNTER — Other Ambulatory Visit: Payer: Self-pay

## 2018-11-15 ENCOUNTER — Ambulatory Visit (INDEPENDENT_AMBULATORY_CARE_PROVIDER_SITE_OTHER): Payer: Medicare Other | Admitting: Podiatry

## 2018-11-15 ENCOUNTER — Other Ambulatory Visit: Payer: Self-pay | Admitting: Podiatry

## 2018-11-15 ENCOUNTER — Ambulatory Visit (INDEPENDENT_AMBULATORY_CARE_PROVIDER_SITE_OTHER): Payer: Medicare Other

## 2018-11-15 VITALS — Temp 96.7°F | Resp 16

## 2018-11-15 DIAGNOSIS — M79671 Pain in right foot: Secondary | ICD-10-CM

## 2018-11-15 DIAGNOSIS — M7751 Other enthesopathy of right foot: Secondary | ICD-10-CM | POA: Diagnosis not present

## 2018-11-15 DIAGNOSIS — Q828 Other specified congenital malformations of skin: Secondary | ICD-10-CM | POA: Diagnosis not present

## 2018-11-15 DIAGNOSIS — M7750 Other enthesopathy of unspecified foot: Secondary | ICD-10-CM

## 2018-11-15 NOTE — Progress Notes (Signed)
Subjective:  Patient ID: Marie Maynard, female    DOB: January 15, 1951,  MRN: [id number]  Chief Complaint  Patient presents with  . Foot Pain    Rt 5th toe joint pain and 5th MPJ x 2 mo; 9/10 shapr pains only when [id number] is applied Tx: meloxicam -pt denies swellign/redness    68 y.o. female presents with the above complaint. Hx as above.  Review of Systems: Negative except as noted in the HPI. Denies N/V/F/Ch.  Past Medical History:  Diagnosis Date  . Anxiety    post-menopausal   . Arthritis   . Cancer ([place])    basal cell skin cancer removed from neck   . Complication of anesthesia 10/31/2015   patient states received too much Phenergan during surgery and recovery and very hard to wake up! Does not want any Phenergan.  . H/O seasonal allergies   . Headache   . Hypothyroidism     Current Outpatient Medications:  .  Calcium-Magnesium-Zinc (CAL-MAG-ZINC PO), Take 1 tablet by mouth daily., Disp: , Rfl:  .  cholecalciferol (VITAMIN D) 1000 units tablet, Take 1,000 Units by mouth daily., Disp: , Rfl:  .  Cholecalciferol (VITAMIN D3) 5000 units TABS, Take 5,000 Units by mouth daily., Disp: , Rfl:  .  CINNAMON PO, Take 1 capsule by mouth 2 (two) times daily., Disp: , Rfl:  .  ezetimibe (ZETIA) 10 MG tablet, , Disp: , Rfl:  .  HYDROmorphone (DILAUDID) 2 MG tablet, Take 1-2 tablets (2-4 mg total) by mouth every 4 (four) hours as needed for severe pain. (Patient not taking: Reported on 04/24/2016), Disp: 40 tablet, Rfl: 0 .  ibuprofen (ADVIL,MOTRIN) 200 MG tablet, Take 400 mg by mouth every 4 (four) hours as needed (for pain.)., Disp: , Rfl:  .  levothyroxine (SYNTHROID, LEVOTHROID) 50 MCG tablet, Take 50 mcg by mouth daily before breakfast., Disp: , Rfl:  .  meloxicam (MOBIC) 15 MG tablet, , Disp: , Rfl:  .  methocarbamol (ROBAXIN) 500 MG tablet, Take 1 tablet (500 mg total) by mouth 4 (four) times daily. As needed for muscle spasm (Patient not taking: Reported on 04/24/2016), Disp: 30  tablet, Rfl: 1 .  mometasone (NASONEX) 50 MCG/ACT nasal spray, Place 2 sprays into the nose daily as needed (for allergies). , Disp: , Rfl:  .  Multiple Vitamin (MULTIVITAMIN WITH MINERALS) TABS tablet, Take 1 tablet by mouth daily., Disp: , Rfl:  .  naproxen sodium (ANAPROX) 220 MG tablet, Take 220-440 mg by mouth 2 (two) times daily as needed (for pain.)., Disp: , Rfl:  .  nitrofurantoin (MACRODANTIN) 100 MG capsule, Take 100 mg by mouth daily as needed (for bladder infections). For bladder , Disp: , Rfl:  .  ondansetron (ZOFRAN) 4 MG tablet, Take 1 tablet (4 mg total) by mouth every 8 (eight) hours as needed for nausea or vomiting. (Patient not taking: Reported on 04/24/2016), Disp: 20 tablet, Rfl: 0 .  pregabalin (LYRICA) 50 MG capsule, Take 50 mg by mouth at bedtime. , Disp: , Rfl:  .  RED YEAST RICE EXTRACT PO, Take 1 capsule by mouth daily., Disp: , Rfl:  .  sertraline (ZOLOFT) 100 MG tablet, Take 100 mg by mouth daily., Disp: , Rfl:  .  TURMERIC PO, Take 1 capsule by mouth daily., Disp: , Rfl:  .  vitamin E 400 UNIT capsule, Take 400 Units by mouth daily., Disp: , Rfl:   Social History   Tobacco Use  Smoking Status Never Smoker  Smokeless Tobacco  Never Used    Allergies  Allergen Reactions  . Codeine Itching  . Neurontin [Gabapentin] Other (See Comments)    Seeing double  . Oxycodone Itching  . Prednisone Anxiety  . Celebrex [Celecoxib]     Nausea and stomach pain   . Ciprofloxacin Diarrhea    Leads to c-diff  . Levaquin [Levofloxacin] Itching and Other (See Comments)    Feeling tired  . Morphine And Related Nausea And Vomiting    Nausea and stomach pain  Nausea and stomach pain   . Promethazine Other (See Comments)    Can't wake up  . Promethazine Hcl   . Sulfamethoxazole-Trimethoprim Diarrhea    Leads to c-diff  . Tylenol With Codeine #3 [Acetaminophen-Codeine]     Skin itching, and Anxiety   . Other Rash    Uncoded Allergy. Allergen: CHLORINE   Objective:    Vitals:   11/15/18 1419  Resp: 16  Temp: (!) 96.7 F (35.9 C)   There is no height or weight on file to calculate BMI. Constitutional Well developed. Well nourished.  Vascular Dorsalis pedis pulses palpable bilaterally. Posterior tibial pulses palpable bilaterally. Capillary refill normal to all digits.  No cyanosis or clubbing noted. Pedal hair growth normal.  Neurologic Normal speech. Oriented to person, place, and time. Epicritic sensation to light touch grossly present bilaterally.  Dermatologic Nails well groomed and normal in appearance. No open wounds. Punctate hyperkeratosis R 5th MPJ  Orthopedic: POP R 5th MPJ   Radiographs: Taken and reviewed no acute fractures or dislocations.  Assessment:   1. Capsulitis of metatarsophalangeal (MTP) joint of right foot   2. Porokeratosis    Plan:  Patient was evaluated and treated and all questions answered.  Capsulitis 5th MPJ Right -XR as above. -Injection as below -Debrided porokeratosis and destroyed with salinocaine  Procedure: Joint Injection Location: Right 5th MPJ joint Skin Prep: Alcohol. Injectate: 0.5 cc 1% lidocaine plain, 0.5 cc dexamethasone phosphate. Disposition: Patient tolerated procedure well. Injection site dressed with a band-aid.  Procedure: Destruction of Lesion Location: R 5th MPJ Anesthesia: none Instrumentation: 15 blade. Technique: Debridement of lesion; Small amount of salinocaine applied to the base of the lesion. Dressing: Dry, sterile, compression dressing. Disposition: Patient tolerated procedure well. Advised to leave dressing on for 6-8 hours.    Return in about 3 weeks (around 12/06/2018).

## 2018-12-13 DIAGNOSIS — R21 Rash and other nonspecific skin eruption: Secondary | ICD-10-CM | POA: Insufficient documentation

## 2018-12-27 ENCOUNTER — Ambulatory Visit: Payer: Medicare Other | Admitting: Podiatry

## 2019-01-11 DIAGNOSIS — G8929 Other chronic pain: Secondary | ICD-10-CM | POA: Insufficient documentation

## 2019-01-17 ENCOUNTER — Other Ambulatory Visit: Payer: Self-pay | Admitting: Orthopedic Surgery

## 2019-02-02 NOTE — Patient Instructions (Signed)
DUE TO COVID-19 ONLY ONE VISITOR IS ALLOWED TO COME WITH YOU AND STAY IN THE WAITING ROOM ONLY DURING PRE OP AND PROCEDURE DAY OF SURGERY. THE 1 VISITOR MAY VISIT WITH YOU AFTER SURGERY IN YOUR PRIVATE ROOM DURING VISITING HOURS ONLY!  YOU NEED TO HAVE A COVID 19 TEST ON___11/19____ @_______ , THIS TEST MUST BE DONE BEFORE SURGERY, COME  [address] , [address].  ([place]) ONCE YOUR COVID TEST IS COMPLETED, PLEASE BEGIN THE QUARANTINE INSTRUCTIONS AS OUTLINED IN YOUR HANDOUT.                Marie Maynard    Your procedure is scheduled on: 02/07/19   Report to [facility] Main  Entrance   Report to admitting at  8:50 AM     Call this number if you have problems the morning of surgery [place], NO CHEWING GUM [place].    Do not eat food After Midnight  . YOU MAY HAVE CLEAR LIQUIDS FROM MIDNIGHT UNTIL 8:15 AM    CLEAR LIQUID DIET   Foods Allowed                                                                     Foods Excluded  Coffee and tea, regular and decaf                             liquids that you cannot  Plain Jell-O any favor except red or purple                                           see through such as: Fruit ices (not with fruit pulp)                                     milk, soups, orange juice  Iced Popsicles                                    All solid food Carbonated beverages, regular and diet                                    Cranberry, grape and apple juices Sports drinks like Gatorade Lightly seasoned clear broth or consume(fat free) Sugar, honey     . At 8:15 AM Please finish the prescribed Pre-Surgery  Drink.   Nothing by mouth after you finish the  drink !    Take these medicines the morning of surgery with A SIP OF WATER: Zoloft, Levothyroxine, pepcid                                 You may not have any metal  on  your body including hair pins and              piercings  Do not wear jewelry, make-up, lotions, powders or perfumes, deodorant             Do not wear nail polish on your fingernails.  Do not shave  48 hours prior to surgery.            Do not bring valuables to the hospital. [place].  Contacts, dentures or bridgework may not be worn into surgery.      .  Name and phone number of your driver:  Special Instructions: N/A              Please read over the following fact sheets you were given: _____________________________________________________________________             [facility] - Preparing for Surgery  Before surgery, you can play an important role.   Because skin is not sterile, your skin needs to be as free of germs as possible.   You can reduce the number of germs on your skin by washing with CHG (chlorahexidine gluconate) soap before surgery.   CHG is an antiseptic cleaner which kills germs and bonds with the skin to continue killing germs even after washing. Please DO NOT use if you have an allergy to CHG or antibacterial soaps.   If your skin becomes reddened/irritated stop using the CHG and inform your nurse when you arrive at Short Stay. Do not shave (including legs and underarms) for at least 48 hours prior to the first CHG shower.   . Please follow these instructions carefully:  1.  Shower with CHG Soap the night before surgery and the  morning of Surgery.  2.  If you choose to wash your hair, wash your hair first as usual with your  normal  shampoo.  3.  After you shampoo, rinse your hair and body thoroughly to remove the  shampoo.                                        4.  Use CHG as you would any other liquid soap.  You can apply chg directly  to the skin and wash                       Gently with a scrungie or clean washcloth.  5.  Apply the CHG Soap to your body ONLY FROM THE NECK DOWN.   Do not use on  face/ open                           Wound or open sores. Avoid contact with eyes, ears mouth and genitals (private parts).                       Wash face,  Genitals (private parts) with your normal soap.             6.  Wash thoroughly, paying special attention to the area where your surgery  will be performed.  7.  Thoroughly rinse your body with warm water from the neck down.  8.  DO NOT shower/wash with your normal soap after using  and rinsing off  the CHG Soap.             9.  Pat yourself dry with a clean towel.            10.  Wear clean pajamas.            11.  Place clean sheets on your bed the night of your first shower and do not  sleep with pets. Day of Surgery : Do not apply any lotions/deodorants the morning of surgery.  Please wear clean clothes to the hospital/surgery center.  FAILURE TO FOLLOW THESE INSTRUCTIONS MAY RESULT IN THE CANCELLATION OF YOUR SURGERY PATIENT SIGNATURE_________________________________  NURSE SIGNATURE__________________________________  ________________________________________________________________________   Marie Maynard  An incentive spirometer is a tool that can help keep your lungs clear and active. This tool measures how well you are filling your lungs with each breath. Taking long deep breaths may help reverse or decrease the chance of developing breathing (pulmonary) problems (especially infection) following:  A long period of time when you are unable to move or be active. BEFORE THE PROCEDURE   If the spirometer includes an indicator to show your best effort, your nurse or respiratory therapist will set it to a desired goal.  If possible, sit up straight or lean slightly forward. Try not to slouch.  Hold the incentive spirometer in an upright position. INSTRUCTIONS FOR USE  1. Sit on the edge of your bed if possible, or sit up as far as you can in bed or on a chair. 2. Hold the incentive spirometer in an upright  position. 3. Breathe out normally. 4. Place the mouthpiece in your mouth and seal your lips tightly around it. 5. Breathe in slowly and as deeply as possible, raising the piston or the ball toward the top of the column. 6. Hold your breath for 3-5 seconds or for as long as possible. Allow the piston or ball to fall to the bottom of the column. 7. Remove the mouthpiece from your mouth and breathe out normally. 8. Rest for a few seconds and repeat Steps 1 through 7 at least 10 times every 1-2 hours when you are awake. Take your time and take a few normal breaths between deep breaths. 9. The spirometer may include an indicator to show your best effort. Use the indicator as a goal to work toward during each repetition. 10. After each set of 10 deep breaths, practice coughing to be sure your lungs are clear. If you have an incision (the cut made at the time of surgery), support your incision when coughing by placing a pillow or rolled up towels firmly against it. Once you are able to get out of bed, walk around indoors and cough well. You may stop using the incentive spirometer when instructed by your caregiver.  RISKS AND COMPLICATIONS  Take your time so you do not get dizzy or light-headed.  If you are in pain, you may need to take or ask for pain medication before doing incentive spirometry. It is harder to take a deep breath if you are having pain. AFTER USE  Rest and breathe slowly and easily.  It can be helpful to keep track of a log of your progress. Your caregiver can provide you with a simple table to help with this. If you are using the spirometer at home, follow these instructions: [place] IF:   You are having difficultly using the spirometer.  You have trouble using the spirometer as  often as instructed.  Your pain medication is not giving enough relief while using the spirometer.  You develop fever of 100.5 F (38.1 C) or higher. SEEK IMMEDIATE MEDICAL CARE IF:    You cough up bloody sputum that had not been present before.  You develop fever of 102 F (38.9 C) or greater.  You develop worsening pain at or near the incision site. MAKE SURE YOU:   Understand these instructions.  Will watch your condition.  Will get help right away if you are not doing well or get worse. Document Released: 07/14/2006 Document Revised: 05/26/2011 Document Reviewed: 09/14/2006 [facility] Patient Information 2014 [place], [place].   ________________________________________________________________________

## 2019-02-03 ENCOUNTER — Encounter (HOSPITAL_COMMUNITY)
Admission: RE | Admit: 2019-02-03 | Discharge: 2019-02-03 | Disposition: A | Discharge status: Home / Self Care | Payer: Medicare Other | Source: Ambulatory Visit | Attending: Orthopedic Surgery | Admitting: Orthopedic Surgery

## 2019-02-03 ENCOUNTER — Other Ambulatory Visit: Payer: Self-pay

## 2019-02-03 ENCOUNTER — Encounter (HOSPITAL_COMMUNITY): Payer: Self-pay

## 2019-02-03 ENCOUNTER — Other Ambulatory Visit (HOSPITAL_COMMUNITY)
Admission: RE | Admit: 2019-02-03 | Discharge: 2019-02-03 | Disposition: A | Discharge status: Home / Self Care | Payer: Medicare Other | Source: Ambulatory Visit | Attending: Orthopedic Surgery | Admitting: Orthopedic Surgery

## 2019-02-03 DIAGNOSIS — Z20828 Contact with and (suspected) exposure to other viral communicable diseases: Secondary | ICD-10-CM | POA: Diagnosis not present

## 2019-02-03 DIAGNOSIS — M1711 Unilateral primary osteoarthritis, right knee: Secondary | ICD-10-CM | POA: Insufficient documentation

## 2019-02-03 DIAGNOSIS — Z01812 Encounter for preprocedural laboratory examination: Secondary | ICD-10-CM | POA: Insufficient documentation

## 2019-02-03 LAB — COMPREHENSIVE METABOLIC PANEL
ALT: 12 U/L (ref 0–44)
AST: 19 U/L (ref 15–41)
Albumin: 4.4 g/dL (ref 3.5–5.0)
Alkaline Phosphatase: 63 U/L (ref 38–126)
Anion gap: 8 (ref 5–15)
BUN: 15 mg/dL (ref 8–23)
CO2: 30 mmol/L (ref 22–32)
Calcium: 8.8 mg/dL — ABNORMAL LOW (ref 8.9–10.3)
Chloride: 103 mmol/L (ref 98–111)
Creatinine, Ser: 0.72 mg/dL (ref 0.44–1.00)
GFR calc Af Amer: >60 mL/min (ref >60)
GFR calc non Af Amer: >60 mL/min (ref >60)
Glucose, Bld: 93 mg/dL (ref 70–99)
Potassium: 4.4 mmol/L (ref 3.5–5.1)
Sodium: 141 mmol/L (ref 135–145)
Total Bilirubin: 0.8 mg/dL (ref 0.3–1.2)
Total Protein: 6.8 g/dL (ref 6.5–8.1)

## 2019-02-03 LAB — CBC WITH DIFFERENTIAL/PLATELET
Abs Immature Granulocytes: 0.01 10*3/uL (ref 0.00–0.07)
Basophils Absolute: 0 10*3/uL (ref 0.0–0.1)
Basophils Relative: 1 %
Eosinophils Absolute: 0.1 10*3/uL (ref 0.0–0.5)
Eosinophils Relative: 2 %
HCT: 43.5 % (ref 36.0–46.0)
Hemoglobin: 13.7 g/dL (ref 12.0–15.0)
Immature Granulocytes: 0 %
Lymphocytes Relative: 26 %
Lymphs Abs: 1.2 10*3/uL (ref 0.7–4.0)
MCH: 28.6 pg (ref 26.0–34.0)
MCHC: 31.5 g/dL (ref 30.0–36.0)
MCV: 90.8 fL (ref 80.0–100.0)
Monocytes Absolute: 0.3 10*3/uL (ref 0.1–1.0)
Monocytes Relative: 7 %
Neutro Abs: 3.1 10*3/uL (ref 1.7–7.7)
Neutrophils Relative %: 64 %
Platelets: 196 10*3/uL (ref 150–400)
RBC: 4.79 MIL/uL (ref 3.87–5.11)
RDW: 13.3 % (ref 11.5–15.5)
WBC: 4.7 10*3/uL (ref 4.0–10.5)
nRBC: 0 % (ref 0.0–0.2)

## 2019-02-03 LAB — SURGICAL PCR SCREEN
MRSA, PCR: NEGATIVE
Staphylococcus aureus: NEGATIVE

## 2019-02-03 NOTE — Progress Notes (Signed)
PCP - Dr. Patsy [place] Cardiologist - none  Chest x-ray - no EKG - no Stress Test - no ECHO - no Cardiac Cath - no  Sleep Study - no CPAP -   Fasting Blood Sugar - NA Checks Blood Sugar _____ times a day  Blood Thinner Instructions:NA Aspirin Instructions: Last Dose:  Anesthesia review:   Patient denies shortness of breath, fever, cough and chest pain at PAT appointment  yes Patient verbalized understanding of instructions that were given to them at the PAT appointment. Patient was also instructed that they will need to review over the PAT instructions again at home before surgery. yes

## 2019-02-04 LAB — NOVEL CORONAVIRUS, NAA (HOSP ORDER, SEND-OUT TO REF LAB; TAT 18-24 HRS): SARS-CoV-2, NAA: NOT DETECTED

## 2019-02-06 MED ORDER — BUPIVACAINE LIPOSOME 1.3 % IJ SUSP
20.0000 mL | Freq: Once | INTRAMUSCULAR | Status: DC
  Filled 2019-02-06: qty 20

## 2019-02-07 ENCOUNTER — Ambulatory Visit (HOSPITAL_COMMUNITY): Payer: Medicare Other | Admitting: Certified Registered"

## 2019-02-07 ENCOUNTER — Ambulatory Visit (HOSPITAL_COMMUNITY): Payer: Medicare Other | Admitting: Physician Assistant

## 2019-02-07 ENCOUNTER — Encounter (HOSPITAL_COMMUNITY)
Admission: RE | Disposition: A | Discharge status: Home / Self Care | Payer: Self-pay | Source: Ambulatory Visit | Attending: Orthopedic Surgery

## 2019-02-07 ENCOUNTER — Encounter (HOSPITAL_COMMUNITY): Payer: Self-pay | Admitting: General Practice

## 2019-02-07 ENCOUNTER — Observation Stay (HOSPITAL_COMMUNITY)
Admission: RE | Admit: 2019-02-07 | Discharge: 2019-02-08 | Disposition: A | Discharge status: Home / Self Care | Payer: Medicare Other | Source: Ambulatory Visit | Attending: Orthopedic Surgery | Admitting: Orthopedic Surgery

## 2019-02-07 ENCOUNTER — Other Ambulatory Visit: Payer: Self-pay

## 2019-02-07 DIAGNOSIS — Z885 Allergy status to narcotic agent status: Secondary | ICD-10-CM | POA: Insufficient documentation

## 2019-02-07 DIAGNOSIS — Z888 Allergy status to other drugs, medicaments and biological substances status: Secondary | ICD-10-CM | POA: Insufficient documentation

## 2019-02-07 DIAGNOSIS — F419 Anxiety disorder, unspecified: Secondary | ICD-10-CM | POA: Diagnosis not present

## 2019-02-07 DIAGNOSIS — Z96659 Presence of unspecified artificial knee joint: Secondary | ICD-10-CM

## 2019-02-07 DIAGNOSIS — E039 Hypothyroidism, unspecified: Secondary | ICD-10-CM | POA: Diagnosis not present

## 2019-02-07 DIAGNOSIS — Z886 Allergy status to analgesic agent status: Secondary | ICD-10-CM | POA: Insufficient documentation

## 2019-02-07 DIAGNOSIS — Z85828 Personal history of other malignant neoplasm of skin: Secondary | ICD-10-CM | POA: Diagnosis not present

## 2019-02-07 DIAGNOSIS — Z881 Allergy status to other antibiotic agents status: Secondary | ICD-10-CM | POA: Insufficient documentation

## 2019-02-07 DIAGNOSIS — M1711 Unilateral primary osteoarthritis, right knee: Principal | ICD-10-CM | POA: Insufficient documentation

## 2019-02-07 DIAGNOSIS — Q211 Atrial septal defect: Secondary | ICD-10-CM | POA: Diagnosis not present

## 2019-02-07 HISTORY — PX: TOTAL KNEE ARTHROPLASTY: SHX125

## 2019-02-07 LAB — POCT I-STAT, CHEM 8
BUN: 13 mg/dL (ref 8–23)
Calcium, Ion: 1.21 mmol/L (ref 1.15–1.40)
Chloride: 101 mmol/L (ref 98–111)
Creatinine, Ser: 0.6 mg/dL (ref 0.44–1.00)
Glucose, Bld: 113 mg/dL — ABNORMAL HIGH (ref 70–99)
HCT: 34 % — ABNORMAL LOW (ref 36.0–46.0)
Hemoglobin: 11.6 g/dL — ABNORMAL LOW (ref 12.0–15.0)
Potassium: 3.6 mmol/L (ref 3.5–5.1)
Sodium: 142 mmol/L (ref 135–145)
TCO2: 26 mmol/L (ref 22–32)

## 2019-02-07 SURGERY — ARTHROPLASTY, KNEE, TOTAL
Anesthesia: General | Site: Knee | Laterality: Right

## 2019-02-07 MED ORDER — MIDAZOLAM HCL 2 MG/2ML IJ SOLN
INTRAMUSCULAR | Status: AC
  Administered 2019-02-07: 09:00:00 2 mg via INTRAVENOUS
  Filled 2019-02-07: qty 2

## 2019-02-07 MED ORDER — CEFAZOLIN SODIUM-DEXTROSE 2-4 GM/100ML-% IV SOLN
INTRAVENOUS | Status: AC
  Filled 2019-02-07: qty 100

## 2019-02-07 MED ORDER — ROCURONIUM BROMIDE 10 MG/ML (PF) SYRINGE
PREFILLED_SYRINGE | INTRAVENOUS | Status: AC
  Filled 2019-02-07: qty 10

## 2019-02-07 MED ORDER — ACETAMINOPHEN 500 MG PO TABS
ORAL_TABLET | ORAL | Status: AC
  Filled 2019-02-07: qty 2

## 2019-02-07 MED ORDER — SODIUM CHLORIDE 0.9 % IR SOLN
Status: DC | PRN
  Administered 2019-02-07: 1000 mL

## 2019-02-07 MED ORDER — POVIDONE-IODINE 10 % EX SWAB: 2.0000 "application " | Freq: Once | CUTANEOUS | Status: DC

## 2019-02-07 MED ORDER — MIDAZOLAM HCL 2 MG/2ML IJ SOLN
1.0000 mg | INTRAMUSCULAR | Status: DC
  Administered 2019-02-07: 09:00:00 2 mg via INTRAVENOUS

## 2019-02-07 MED ORDER — METOCLOPRAMIDE HCL 5 MG PO TABS: 5.0000 mg | ORAL_TABLET | Freq: Three times a day (TID) | ORAL | Status: DC | PRN

## 2019-02-07 MED ORDER — ASPIRIN EC 325 MG PO TBEC
325.0000 mg | DELAYED_RELEASE_TABLET | Freq: Two times a day (BID) | ORAL | Status: DC
  Administered 2019-02-08: 10:00:00 325 mg via ORAL
  Filled 2019-02-07: qty 1

## 2019-02-07 MED ORDER — HYDROCODONE-ACETAMINOPHEN 5-325 MG PO TABS
1.0000 | ORAL_TABLET | Freq: Four times a day (QID) | ORAL | Status: DC | PRN
  Administered 2019-02-07: 14:00:00 2 via ORAL
  Filled 2019-02-07: qty 2

## 2019-02-07 MED ORDER — ONDANSETRON HCL 4 MG PO TABS: 4.0000 mg | ORAL_TABLET | Freq: Four times a day (QID) | ORAL | Status: DC | PRN

## 2019-02-07 MED ORDER — FENTANYL CITRATE (PF) 100 MCG/2ML IJ SOLN
25.0000 ug | INTRAMUSCULAR | Status: DC | PRN
  Administered 2019-02-07 (×2): 50 ug via INTRAVENOUS

## 2019-02-07 MED ORDER — DIPHENHYDRAMINE HCL 12.5 MG/5ML PO ELIX: 12.5000 mg | ORAL_SOLUTION | ORAL | Status: DC | PRN

## 2019-02-07 MED ORDER — MENTHOL 3 MG MT LOZG: 1.0000 | LOZENGE | OROMUCOSAL | Status: DC | PRN

## 2019-02-07 MED ORDER — ALBUMIN HUMAN 5 % IV SOLN
INTRAVENOUS | Status: DC | PRN
  Administered 2019-02-07: 11:00:00 via INTRAVENOUS

## 2019-02-07 MED ORDER — ROCURONIUM BROMIDE 50 MG/5ML IV SOSY
PREFILLED_SYRINGE | INTRAVENOUS | Status: DC | PRN
  Administered 2019-02-07: 50 mg via INTRAVENOUS

## 2019-02-07 MED ORDER — ESMOLOL HCL 100 MG/10ML IV SOLN
INTRAVENOUS | Status: DC | PRN
  Administered 2019-02-07: 10 mg via INTRAVENOUS

## 2019-02-07 MED ORDER — ONDANSETRON HCL 4 MG/2ML IJ SOLN
INTRAMUSCULAR | Status: DC | PRN
  Administered 2019-02-07: 4 mg via INTRAVENOUS

## 2019-02-07 MED ORDER — LIDOCAINE 2% (20 MG/ML) 5 ML SYRINGE
INTRAMUSCULAR | Status: DC | PRN
  Administered 2019-02-07: 60 mg via INTRAVENOUS

## 2019-02-07 MED ORDER — LACTATED RINGERS IV SOLN
INTRAVENOUS | Status: DC
  Administered 2019-02-07 (×2): via INTRAVENOUS

## 2019-02-07 MED ORDER — PANTOPRAZOLE SODIUM 40 MG PO TBEC
40.0000 mg | DELAYED_RELEASE_TABLET | Freq: Every day | ORAL | Status: DC
  Administered 2019-02-07 – 2019-02-08 (×2): 40 mg via ORAL
  Filled 2019-02-07 (×2): qty 1

## 2019-02-07 MED ORDER — PHENOL 1.4 % MT LIQD: 1.0000 | OROMUCOSAL | Status: DC | PRN

## 2019-02-07 MED ORDER — TRANEXAMIC ACID-NACL 1000-0.7 MG/100ML-% IV SOLN
1000.0000 mg | INTRAVENOUS | Status: AC
  Administered 2019-02-07: 1000 mg via INTRAVENOUS

## 2019-02-07 MED ORDER — PREGABALIN 50 MG PO CAPS: 50.0000 mg | ORAL_CAPSULE | Freq: Every day | ORAL | Status: DC

## 2019-02-07 MED ORDER — EPHEDRINE SULFATE 50 MG/ML IJ SOLN
INTRAMUSCULAR | Status: DC | PRN
  Administered 2019-02-07 (×3): 10 mg via INTRAVENOUS

## 2019-02-07 MED ORDER — ACETAMINOPHEN 325 MG PO TABS: 325.0000 mg | ORAL_TABLET | Freq: Four times a day (QID) | ORAL | Status: DC | PRN

## 2019-02-07 MED ORDER — DEXAMETHASONE SODIUM PHOSPHATE 10 MG/ML IJ SOLN
INTRAMUSCULAR | Status: AC
  Filled 2019-02-07: qty 1

## 2019-02-07 MED ORDER — HYDROCODONE-ACETAMINOPHEN 5-325 MG PO TABS
1.0000 | ORAL_TABLET | ORAL | Status: DC | PRN
  Administered 2019-02-07 – 2019-02-08 (×4): 2 via ORAL
  Filled 2019-02-07 (×4): qty 2

## 2019-02-07 MED ORDER — TRANEXAMIC ACID-NACL 1000-0.7 MG/100ML-% IV SOLN
1000.0000 mg | Freq: Once | INTRAVENOUS | Status: AC
  Administered 2019-02-07: 15:00:00 1000 mg via INTRAVENOUS
  Filled 2019-02-07: qty 100

## 2019-02-07 MED ORDER — FENTANYL CITRATE (PF) 100 MCG/2ML IJ SOLN
INTRAMUSCULAR | Status: DC | PRN
  Administered 2019-02-07: 100 ug via INTRAVENOUS
  Administered 2019-02-07 (×3): 50 ug via INTRAVENOUS

## 2019-02-07 MED ORDER — LIDOCAINE 2% (20 MG/ML) 5 ML SYRINGE
INTRAMUSCULAR | Status: AC
  Filled 2019-02-07: qty 5

## 2019-02-07 MED ORDER — METOCLOPRAMIDE HCL 5 MG/ML IJ SOLN: 5.0000 mg | Freq: Three times a day (TID) | INTRAMUSCULAR | Status: DC | PRN

## 2019-02-07 MED ORDER — SODIUM CHLORIDE (PF) 0.9 % IJ SOLN
INTRAMUSCULAR | Status: AC
  Filled 2019-02-07: qty 20

## 2019-02-07 MED ORDER — CHLORHEXIDINE GLUCONATE 4 % EX LIQD: 60.0000 mL | Freq: Once | CUTANEOUS | Status: DC

## 2019-02-07 MED ORDER — WATER FOR IRRIGATION, STERILE IR SOLN
Status: DC | PRN
  Administered 2019-02-07: 2000 mL

## 2019-02-07 MED ORDER — CEFAZOLIN SODIUM-DEXTROSE 2-4 GM/100ML-% IV SOLN
2.0000 g | INTRAVENOUS | Status: AC
  Administered 2019-02-07: 2 g via INTRAVENOUS

## 2019-02-07 MED ORDER — NITROFURANTOIN MACROCRYSTAL 50 MG PO CAPS
50.0000 mg | ORAL_CAPSULE | Freq: Every day | ORAL | Status: DC
  Administered 2019-02-07: 21:00:00 50 mg via ORAL
  Filled 2019-02-07 (×2): qty 1

## 2019-02-07 MED ORDER — DEXAMETHASONE SODIUM PHOSPHATE 10 MG/ML IJ SOLN
8.0000 mg | Freq: Once | INTRAMUSCULAR | Status: AC
  Administered 2019-02-07: 4 mg via INTRAVENOUS

## 2019-02-07 MED ORDER — ONDANSETRON HCL 4 MG/2ML IJ SOLN: 4.0000 mg | Freq: Four times a day (QID) | INTRAMUSCULAR | Status: DC | PRN

## 2019-02-07 MED ORDER — DEXAMETHASONE SODIUM PHOSPHATE 10 MG/ML IJ SOLN
10.0000 mg | Freq: Once | INTRAMUSCULAR | Status: AC
  Administered 2019-02-08: 10:00:00 10 mg via INTRAVENOUS
  Filled 2019-02-07: qty 1

## 2019-02-07 MED ORDER — PROPOFOL 10 MG/ML IV BOLUS
INTRAVENOUS | Status: AC
  Filled 2019-02-07: qty 20

## 2019-02-07 MED ORDER — DOCUSATE SODIUM 100 MG PO CAPS
100.0000 mg | ORAL_CAPSULE | Freq: Two times a day (BID) | ORAL | Status: DC
  Administered 2019-02-07 – 2019-02-08 (×2): 100 mg via ORAL
  Filled 2019-02-07 (×2): qty 1

## 2019-02-07 MED ORDER — 0.9 % SODIUM CHLORIDE (POUR BTL) OPTIME
TOPICAL | Status: DC | PRN
  Administered 2019-02-07: 1000 mL

## 2019-02-07 MED ORDER — EZETIMIBE 10 MG PO TABS
10.0000 mg | ORAL_TABLET | Freq: Every day | ORAL | Status: DC
  Administered 2019-02-08: 10:00:00 10 mg via ORAL
  Filled 2019-02-07 (×2): qty 1

## 2019-02-07 MED ORDER — SENNOSIDES-DOCUSATE SODIUM 8.6-50 MG PO TABS: 1.0000 | ORAL_TABLET | Freq: Every evening | ORAL | Status: DC | PRN

## 2019-02-07 MED ORDER — FENTANYL CITRATE (PF) 250 MCG/5ML IJ SOLN
INTRAMUSCULAR | Status: AC
  Filled 2019-02-07: qty 5

## 2019-02-07 MED ORDER — BISACODYL 5 MG PO TBEC: 5.0000 mg | DELAYED_RELEASE_TABLET | Freq: Every day | ORAL | Status: DC | PRN

## 2019-02-07 MED ORDER — SUGAMMADEX SODIUM 200 MG/2ML IV SOLN
INTRAVENOUS | Status: DC | PRN
  Administered 2019-02-07: 200 mg via INTRAVENOUS
  Administered 2019-02-07: 100 mg via INTRAVENOUS

## 2019-02-07 MED ORDER — CEFAZOLIN SODIUM-DEXTROSE 2-4 GM/100ML-% IV SOLN
2.0000 g | Freq: Four times a day (QID) | INTRAVENOUS | Status: AC
  Administered 2019-02-07 (×2): 2 g via INTRAVENOUS
  Filled 2019-02-07 (×2): qty 100

## 2019-02-07 MED ORDER — FLEET ENEMA 7-19 GM/118ML RE ENEM: 1.0000 | ENEMA | Freq: Once | RECTAL | Status: DC | PRN

## 2019-02-07 MED ORDER — ZOLPIDEM TARTRATE 5 MG PO TABS: 5.0000 mg | ORAL_TABLET | Freq: Every evening | ORAL | Status: DC | PRN

## 2019-02-07 MED ORDER — FAMOTIDINE 20 MG PO TABS
20.0000 mg | ORAL_TABLET | Freq: Every day | ORAL | Status: DC
  Administered 2019-02-07: 21:00:00 20 mg via ORAL
  Filled 2019-02-07: qty 1

## 2019-02-07 MED ORDER — SODIUM CHLORIDE 0.9 % IV SOLN
INTRAVENOUS | Status: DC
  Administered 2019-02-08: 03:00:00 via INTRAVENOUS

## 2019-02-07 MED ORDER — METHOCARBAMOL 500 MG IVPB - SIMPLE MED
INTRAVENOUS | Status: AC
  Filled 2019-02-07: qty 50

## 2019-02-07 MED ORDER — METHOCARBAMOL 500 MG IVPB - SIMPLE MED
500.0000 mg | Freq: Four times a day (QID) | INTRAVENOUS | Status: DC | PRN
  Administered 2019-02-07: 12:00:00 500 mg via INTRAVENOUS
  Filled 2019-02-07: qty 50

## 2019-02-07 MED ORDER — BUPIVACAINE HCL (PF) 0.25 % IJ SOLN
INTRAMUSCULAR | Status: AC
  Filled 2019-02-07: qty 30

## 2019-02-07 MED ORDER — LEVOTHYROXINE SODIUM 50 MCG PO TABS
50.0000 ug | ORAL_TABLET | Freq: Every day | ORAL | Status: DC
  Administered 2019-02-08: 06:00:00 50 ug via ORAL
  Filled 2019-02-07: qty 1

## 2019-02-07 MED ORDER — ALUM & MAG HYDROXIDE-SIMETH 200-200-20 MG/5ML PO SUSP: 30.0000 mL | ORAL | Status: DC | PRN

## 2019-02-07 MED ORDER — PREGABALIN 50 MG PO CAPS
50.0000 mg | ORAL_CAPSULE | Freq: Every day | ORAL | Status: DC
  Administered 2019-02-07: 50 mg via ORAL
  Filled 2019-02-07: qty 1

## 2019-02-07 MED ORDER — TRAMADOL HCL 50 MG PO TABS
50.0000 mg | ORAL_TABLET | Freq: Four times a day (QID) | ORAL | Status: DC
  Administered 2019-02-07 – 2019-02-08 (×4): 50 mg via ORAL
  Filled 2019-02-07 (×4): qty 1

## 2019-02-07 MED ORDER — EPHEDRINE 5 MG/ML INJ
INTRAVENOUS | Status: AC
  Filled 2019-02-07: qty 10

## 2019-02-07 MED ORDER — BUPIVACAINE LIPOSOME 1.3 % IJ SUSP
INTRAMUSCULAR | Status: DC | PRN
  Administered 2019-02-07: 20 mL

## 2019-02-07 MED ORDER — TRANEXAMIC ACID-NACL 1000-0.7 MG/100ML-% IV SOLN
INTRAVENOUS | Status: AC
  Filled 2019-02-07: qty 100

## 2019-02-07 MED ORDER — BUPIVACAINE HCL (PF) 0.25 % IJ SOLN
INTRAMUSCULAR | Status: DC | PRN
  Administered 2019-02-07: 30 mL

## 2019-02-07 MED ORDER — FENTANYL CITRATE (PF) 100 MCG/2ML IJ SOLN
INTRAMUSCULAR | Status: AC
  Administered 2019-02-07: 50 ug via INTRAVENOUS
  Filled 2019-02-07: qty 2

## 2019-02-07 MED ORDER — SODIUM CHLORIDE 0.9% FLUSH
INTRAVENOUS | Status: DC | PRN
  Administered 2019-02-07: 20 mL

## 2019-02-07 MED ORDER — FENTANYL CITRATE (PF) 100 MCG/2ML IJ SOLN
INTRAMUSCULAR | Status: AC
  Filled 2019-02-07: qty 2

## 2019-02-07 MED ORDER — SERTRALINE HCL 100 MG PO TABS
100.0000 mg | ORAL_TABLET | Freq: Every day | ORAL | Status: DC
  Administered 2019-02-08: 10:00:00 100 mg via ORAL
  Filled 2019-02-07: qty 1

## 2019-02-07 MED ORDER — FENTANYL CITRATE (PF) 100 MCG/2ML IJ SOLN
50.0000 ug | INTRAMUSCULAR | Status: DC
  Administered 2019-02-07: 09:00:00 50 ug via INTRAVENOUS

## 2019-02-07 MED ORDER — METHOCARBAMOL 500 MG PO TABS
500.0000 mg | ORAL_TABLET | Freq: Four times a day (QID) | ORAL | Status: DC | PRN
  Administered 2019-02-07 – 2019-02-08 (×2): 500 mg via ORAL
  Filled 2019-02-07 (×2): qty 1

## 2019-02-07 MED ORDER — FERROUS SULFATE 325 (65 FE) MG PO TABS
325.0000 mg | ORAL_TABLET | Freq: Three times a day (TID) | ORAL | Status: DC
  Administered 2019-02-07 – 2019-02-08 (×3): 325 mg via ORAL
  Filled 2019-02-07 (×3): qty 1

## 2019-02-07 MED ORDER — PROPOFOL 10 MG/ML IV BOLUS
INTRAVENOUS | Status: DC | PRN
  Administered 2019-02-07: 130 mg via INTRAVENOUS

## 2019-02-07 MED ORDER — ONDANSETRON HCL 4 MG/2ML IJ SOLN
INTRAMUSCULAR | Status: AC
  Filled 2019-02-07: qty 2

## 2019-02-07 MED ORDER — ACETAMINOPHEN 500 MG PO TABS
1000.0000 mg | ORAL_TABLET | Freq: Once | ORAL | Status: AC
  Administered 2019-02-07: 09:00:00 1000 mg via ORAL

## 2019-02-07 SURGICAL SUPPLY — 67 items
ARTISURF 10M PLY R 6-9CD KNEE (Knees) ×2 IMPLANT
BAG SPEC THK2 15X12 ZIP CLS (MISCELLANEOUS) ×1
BAG ZIPLOCK 12X15 (MISCELLANEOUS) ×3 IMPLANT
BLADE SAGITTAL 13X1.27X60 (BLADE) ×2 IMPLANT
BLADE SAGITTAL 13X1.27X60MM (BLADE) ×1
BLADE SAW SGTL 83.5X18.5 (BLADE) ×3 IMPLANT
BLADE SURG 15 STRL LF DISP TIS (BLADE) ×1 IMPLANT
BLADE SURG 15 STRL SS (BLADE) ×3
BLADE SURG SZ10 CARB STEEL (BLADE) ×6 IMPLANT
BNDG ELASTIC 6X5.8 VLCR STR LF (GAUZE/BANDAGES/DRESSINGS) ×3 IMPLANT
BOWL SMART MIX CTS (DISPOSABLE) ×3 IMPLANT
BSPLAT TIB 5D D CMNT STM RT (Knees) ×1 IMPLANT
CEMENT BONE SIMPLEX SPEEDSET (Cement) ×6 IMPLANT
CLOSURE STERI-STRIP 1/2X4 (GAUZE/BANDAGES/DRESSINGS) ×2
CLOSURE WOUND 1/2 X4 (GAUZE/BANDAGES/DRESSINGS) ×1
CLSR STERI-STRIP ANTIMIC 1/2X4 (GAUZE/BANDAGES/DRESSINGS) ×2 IMPLANT
COVER SURGICAL LIGHT HANDLE (MISCELLANEOUS) ×3 IMPLANT
COVER WAND RF STERILE (DRAPES) ×2 IMPLANT
CUFF TOURN SGL QUICK 34 (TOURNIQUET CUFF) ×3
CUFF TRNQT CYL 34X4.125X (TOURNIQUET CUFF) ×1 IMPLANT
DECANTER SPIKE VIAL GLASS SM (MISCELLANEOUS) ×6 IMPLANT
DRAPE INCISE IOBAN 66X45 STRL (DRAPES) ×6 IMPLANT
DRAPE U-SHAPE 47X51 STRL (DRAPES) ×3 IMPLANT
DRESSING AQUACEL AG SP 3.5X10 (GAUZE/BANDAGES/DRESSINGS) IMPLANT
DRSG AQUACEL AG ADV 3.5X10 (GAUZE/BANDAGES/DRESSINGS) ×3 IMPLANT
DRSG AQUACEL AG SP 3.5X10 (GAUZE/BANDAGES/DRESSINGS) ×3
DURAPREP 26ML APPLICATOR (WOUND CARE) ×6 IMPLANT
ELECT REM PT RETURN 15FT ADLT (MISCELLANEOUS) ×3 IMPLANT
FEMUR  CMT CCR STD SZ7 R KNEE (Knees) ×2 IMPLANT
FEMUR CMT CCR STD SZ7 R KNEE (Knees) ×1 IMPLANT
FEMUR CMTD CCR STD SZ7 R KNEE (Knees) IMPLANT
GLOVE BIOGEL M STRL SZ7.5 (GLOVE) ×3 IMPLANT
GLOVE BIOGEL PI IND STRL 7.5 (GLOVE) ×1 IMPLANT
GLOVE BIOGEL PI IND STRL 8.5 (GLOVE) ×2 IMPLANT
GLOVE BIOGEL PI INDICATOR 7.5 (GLOVE) ×2
GLOVE BIOGEL PI INDICATOR 8.5 (GLOVE) ×4
GLOVE SURG ORTHO 8.0 STRL STRW (GLOVE) ×9 IMPLANT
GOWN STRL REUS W/ TWL XL LVL3 (GOWN DISPOSABLE) ×2 IMPLANT
GOWN STRL REUS W/TWL XL LVL3 (GOWN DISPOSABLE) ×6
HANDPIECE INTERPULSE COAX TIP (DISPOSABLE) ×3
HDLS TROCR DRIL PIN KNEE 75 (PIN) ×2
HOLDER FOLEY CATH W/STRAP (MISCELLANEOUS) ×3 IMPLANT
HOOD PEEL AWAY FLYTE STAYCOOL (MISCELLANEOUS) ×9 IMPLANT
KIT TURNOVER KIT A (KITS) IMPLANT
MANIFOLD NEPTUNE II (INSTRUMENTS) ×3 IMPLANT
NEEDLE HYPO 22GX1.5 SAFETY (NEEDLE) ×3 IMPLANT
NS IRRIG 1000ML POUR BTL (IV SOLUTION) ×3 IMPLANT
PACK TOTAL KNEE CUSTOM (KITS) ×3 IMPLANT
PENCIL SMOKE EVACUATOR (MISCELLANEOUS) IMPLANT
PIN DRILL HDLS TROCAR 75 4PK (PIN) IMPLANT
PROTECTOR NERVE ULNAR (MISCELLANEOUS) ×3 IMPLANT
SET HNDPC FAN SPRY TIP SCT (DISPOSABLE) ×1 IMPLANT
STEM POLY PAT PLY 32M KNEE (Knees) ×2 IMPLANT
STEM TIBIA 5 DEG SZ D R KNEE (Knees) IMPLANT
STRIP CLOSURE SKIN 1/2X4 (GAUZE/BANDAGES/DRESSINGS) ×2 IMPLANT
SUT BONE WAX W31G (SUTURE) ×3 IMPLANT
SUT MNCRL AB 3-0 PS2 18 (SUTURE) ×3 IMPLANT
SUT STRATAFIX 0 PDS 27 VIOLET (SUTURE) ×3
SUT STRATAFIX PDS+ 0 24IN (SUTURE) ×3 IMPLANT
SUT VIC AB 1 CT1 36 (SUTURE) ×3 IMPLANT
SUTURE STRATFX 0 PDS 27 VIOLET (SUTURE) ×1 IMPLANT
SYR CONTROL 10ML LL (SYRINGE) ×6 IMPLANT
TIBIA STEM 5 DEG SZ D R KNEE (Knees) ×3 IMPLANT
TRAY FOLEY MTR SLVR 16FR STAT (SET/KITS/TRAYS/PACK) ×1 IMPLANT
WATER STERILE IRR 1000ML POUR (IV SOLUTION) ×6 IMPLANT
WRAP KNEE MAXI GEL POST OP (GAUZE/BANDAGES/DRESSINGS) ×3 IMPLANT
YANKAUER SUCT BULB TIP 10FT TU (MISCELLANEOUS) ×3 IMPLANT

## 2019-02-07 NOTE — Anesthesia Postprocedure Evaluation (Signed)
Anesthesia Post Note  Patient: Marie Maynard  Procedure(s) Performed: TOTAL KNEE ARTHROPLASTY (Right Knee)     Patient location during evaluation: PACU Anesthesia Type: General and Regional Level of consciousness: awake Pain management: pain level controlled Respiratory status: spontaneous breathing Cardiovascular status: stable Postop Assessment: no apparent nausea or vomiting Anesthetic complications: no    Last Vitals:  Vitals:   02/07/19 1145 02/07/19 1200  BP: 128/79 126/76  Pulse: 99 (!) 101  Resp: 17 17  Temp:    SpO2: 97% 95%    Last Pain:  Vitals:   02/07/19 1200  TempSrc:   PainSc: Asleep                 Tadao Emig

## 2019-02-07 NOTE — H&P (Signed)
Jariah Cookston MRN:  [id number] DOB/SEX:  11-27-1950/female  CHIEF COMPLAINT:  Painful right Knee  HISTORY: Patient is a 68 y.o. female presented with a history of pain in the right knee. Onset of symptoms was gradual starting a few years ago with gradually worsening course since that time. Patient has been treated conservatively with over-the-counter NSAIDs and activity modification. Patient currently rates pain in the knee at 10 out of 10 with activity. There is pain at night.  PAST MEDICAL HISTORY: Patient Active Problem List   Diagnosis Date Noted  . Osteoarthritis of knee 03/31/2018  . Recurrent UTI 08/14/2016  . Lateral meniscal tear 10/31/2015  . PFO (patent foramen ovale)   . Hypokalemia 03/03/2014  . Chest pain 03/03/2014   Past Medical History:  Diagnosis Date  . Anxiety    post-menopausal   . Arthritis   . 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 Complication of anesthesia 10/31/2015   patient states received too much Phenergan during surgery and recovery and very hard to wake up! Does not want any Phenergan.  . H/O seasonal allergies   . Headache   . Hypothyroidism    Past Surgical History:  Procedure Laterality Date  . ABDOMINAL HYSTERECTOMY     partial   . APPENDECTOMY    . BREAST BIOPSY     left   . CARPAL TUNNEL RELEASE     left   . CHOLECYSTECTOMY  2005  . CYSTOCELE REPAIR    . goiter surgery  2005  . jaw arthrocentesis    . KNEE ARTHROSCOPY WITH LATERAL MENISECTOMY Right 10/31/2015   Procedure: RIGHT KNEE ARTHROSCOPY WITH MENISCAL DEBRIDEMENT;  Surgeon: Gaynelle Arabian, MD;  Location: [facility];  Service: Orthopedics;  Laterality: Right;  . lumbar injections      x 2  . spinal radiofrequency ablation      x 3  . TONSILLECTOMY       MEDICATIONS:   No medications prior to admission.    ALLERGIES:   Allergies  Allergen Reactions  . Codeine Itching  . Dilaudid [Hydromorphone Hcl] Itching, Nausea And Vomiting and Anxiety   . Neurontin [Gabapentin] Other (See Comments)    Seeing double  . Oxycodone Itching  . Prednisone Anxiety  . Celebrex [Celecoxib]     Nausea and stomach pain   . Ciprofloxacin Diarrhea    Leads to c-diff  . Levaquin [Levofloxacin] Itching and Other (See Comments)    Feeling tired  . Morphine And Related Nausea And Vomiting    Nausea and stomach pain  Nausea and stomach pain   . Promethazine Other (See Comments)    Can't wake up  . Sulfamethoxazole-Trimethoprim Diarrhea    Leads to c-diff  . Tylenol With Codeine #3 [Acetaminophen-Codeine]     Skin itching, and Anxiety   . Other Rash    Uncoded Allergy. Allergen: CHLORINE    REVIEW OF SYSTEMS:  A comprehensive review of systems was negative except for: Musculoskeletal: positive for arthralgias and bone pain   FAMILY HISTORY:   Family History  Problem Relation Age of Onset  . COPD Mother   . Pancreatic cancer Father   . Pulmonary Hypertension Sister   . Congenital heart disease Brother     SOCIAL HISTORY:   Social History   Tobacco Use  . Smoking status: Never Smoker  . Smokeless tobacco: Never Used  Substance Use Topics  . Alcohol use: No     EXAMINATION:  Vital signs in last 24 hours:    There were no vitals taken for this visit.  General Appearance:    Alert, cooperative, no distress, appears stated age  Head:    Normocephalic, without obvious abnormality, atraumatic  Eyes:    PERRL, conjunctiva/corneas clear, EOM's intact, fundi    benign, both eyes  Ears:    Normal TM's and external ear canals, both ears  Nose:   Nares normal, septum midline, mucosa normal, no drainage    or sinus tenderness  Throat:   Lips, mucosa, and tongue normal; teeth and gums normal  Neck:   Supple, symmetrical, trachea midline, no adenopathy;    thyroid:  no enlargement/tenderness/nodules; no carotid   bruit or JVD  Back:     Symmetric, no curvature, ROM normal, no CVA tenderness  Lungs:     Clear to auscultation  bilaterally, respirations unlabored  Chest Wall:    No tenderness or deformity   Heart:    Regular rate and rhythm, S1 and S2 normal, no murmur, rub   or gallop  Breast Exam:    No tenderness, masses, or nipple abnormality  Abdomen:     Soft, non-tender, bowel sounds active all four quadrants,    no masses, no organomegaly  Genitalia:    Normal female without lesion, discharge or tenderness  Rectal:    Normal tone, no masses or tenderness;   guaiac negative stool  Extremities:   Extremities normal, atraumatic, no cyanosis or edema  Pulses:   2+ and symmetric all extremities  Skin:   Skin color, texture, turgor normal, no rashes or lesions  Lymph nodes:   Cervical, supraclavicular, and axillary nodes normal  Neurologic:   CNII-XII intact, normal strength, sensation and reflexes    throughout    Musculoskeletal:  ROM 0--120, Ligaments intact,  Imaging Review Plain radiographs demonstrate severe degenerative joint disease of the right knee. The overall alignment is neutral. The bone quality appears to be good for age and reported activity level.  Assessment/Plan: Primary osteoarthritis, right knee   The patient history, physical examination and imaging studies are consistent with advanced degenerative joint disease of the right knee. The patient has failed conservative treatment.  The clearance notes were reviewed.  After discussion with the patient it was felt that Total Knee Replacement was indicated. The procedure,  risks, and benefits of total knee arthroplasty were presented and reviewed. The risks including but not limited to aseptic loosening, infection, blood clots, vascular injury, stiffness, patella tracking problems complications among others were discussed. The patient acknowledged the explanation, agreed to proceed with the plan.  Preoperative templating of the joint replacement has been completed, documented, and submitted to the Operating Room personnel in order to optimize  intra-operative equipment management.    Patient's anticipated LOS is less than 2 midnights, meeting these requirements: - Lives within 1 hour of care - Has a competent adult at home to recover with post-op recover - NO history of  - Chronic pain requiring opiods  - Diabetes  - Coronary Artery Disease  - Heart failure  - Heart attack  - Stroke  - DVT/VTE  - Cardiac arrhythmia  - Respiratory Failure/COPD  - Renal failure  - Anemia  - Advanced Liver disease       Donia Ast 02/07/2019, 6:18 AM

## 2019-02-07 NOTE — Anesthesia Procedure Notes (Signed)
Procedure Name: Intubation Date/Time: 02/07/2019 9:48 AM Performed by: Deliah Boston, CRNA Pre-anesthesia Checklist: Patient identified, Emergency Drugs available, Suction available and Patient being monitored Patient Re-evaluated:Patient Re-evaluated prior to induction Oxygen Delivery Method: Circle system utilized Preoxygenation: Pre-oxygenation with 100% oxygen Induction Type: IV induction Ventilation: Mask ventilation without difficulty Laryngoscope Size: Mac and 3 Grade View: Grade II Tube type: Oral Tube size: 7.0 mm Number of attempts: 1 Airway Equipment and Method: Stylet and Oral airway Placement Confirmation: ETT inserted through vocal cords under direct vision,  positive ETCO2 and breath sounds checked- equal and bilateral Secured at: 22 cm Tube secured with: Tape Dental Injury: Teeth and Oropharynx as per pre-operative assessment

## 2019-02-07 NOTE — Op Note (Signed)
TOTAL KNEE REPLACEMENT OPERATIVE NOTE:  02/07/2019  11:23 AM  PATIENT:  Marie Maynard  68 y.o. female  PRE-OPERATIVE DIAGNOSIS:  Primary Osteoarthritis Right Knee  POST-OPERATIVE DIAGNOSIS:  Primary Osteoarthritis Right Knee  PROCEDURE:  Procedure(s): TOTAL KNEE ARTHROPLASTY  SURGEON:  Surgeon(s): Vickey Huger, MD  PHYSICIAN ASSISTANT: Carlyon Shadow, PA-C  ANESTHESIA:   general  SPECIMEN: None  COUNTS:  Correct  TOURNIQUET:   Total Tourniquet Time Documented: Thigh (Right) - 36 minutes Total: Thigh (Right) - 36 minutes   DICTATION:  Indication for procedure:    The patient is a 68 y.o. female who has failed conservative treatment for Primary Osteoarthritis Right Knee.  Informed consent was obtained prior to anesthesia. The risks versus benefits of the operation were explain and in a way the patient can, and did, understand.    Description of procedure:     The patient was taken to the operating room and placed under anesthesia.  The patient was positioned in the usual fashion taking care that all body parts were adequately padded and/or protected.  A tourniquet was applied and the leg prepped and draped in the usual sterile fashion.  The extremity was exsanguinated with the esmarch and tourniquet inflated to 350 mmHg.  Pre-operative range of motion was normal.    A midline incision approximately 6-7 inches long was made with a #10 blade.  A new blade was used to make a parapatellar arthrotomy going 2-3 cm into the quadriceps tendon, over the patella, and alongside the medial aspect of the patellar tendon.  A synovectomy was then performed with the #10 blade and forceps. I then elevated the deep MCL off the medial tibial metaphysis subperiosteally around to the semimembranosus attachment.    I everted the patella and used calipers to measure patellar thickness.  I used the reamer to ream down to appropriate thickness to recreate the native thickness.  I then  removed excess bone with the rongeur and sagittal saw.  I used the appropriately sized template and drilled the three lug holes.  I then put the trial in place and measured the thickness with the calipers to ensure recreation of the native thickness.  The trial was then removed and the patella subluxed and the knee brought into flexion.  A homan retractor was place to retract and protect the patella and lateral structures.  A Z-retractor was place medially to protect the medial structures.  The extra-medullary alignment system was used to make cut the tibial articular surface perpendicular to the anamotic axis of the tibia and in 3 degrees of posterior slope.  The cut surface and alignment jig was removed.  I then used the intramedullary alignment guide to make a 4 valgus cut on the distal femur.  I then marked out the epicondylar axis on the distal femur.   I then used the anterior referencing sizer and measured the femur to be a size 7.  The 4-In-1 cutting block was screwed into place in external rotation matching the posterior condylar angle, making our cuts perpendicular to the epicondylar axis.  Anterior, posterior and chamfer cuts were made with the sagittal saw.  The cutting block and cut pieces were removed.  A lamina spreader was placed in 90 degrees of flexion.  The ACL, PCL, menisci, and posterior condylar osteophytes were removed.  A 10 mm spacer blocked was found to offer good flexion and extension gap balance after minimal in degree releasing.   The scoop retractor was then placed and the  femoral finishing block was pinned in place.  The small sagittal saw was used as well as the lug drill to finish the femur.  The block and cut surfaces were removed and the medullary canal hole filled with autograft bone from the cut pieces.  The tibia was delivered forward in deep flexion and external rotation.  A size D tray was selected and pinned into place centered on the medial 1/3 of the tibial  tubercle.  The reamer and keel was used to prepare the tibia through the tray.    I then trialed with the size 7 femur, size D tibia, a 10 mm insert and the 32 patella.  I had excellent flexion/extension gap balance, excellent patella tracking.  Flexion was full and beyond 120 degrees; extension was zero.  These components were chosen and the staff opened them to me on the back table while the knee was lavaged copiously and the cement mixed.  The soft tissue was infiltrated with 60cc of exparel 1.3% through a 21 gauge needle.  I cemented in the components and removed all excess cement.  The polyethylene tibial component was snapped into place and the knee placed in extension while cement was hardening.  The capsule was infilltrated with a 60cc exparel/marcaine/saline mixture.   Once the cement was hard, the tourniquet was let down.  Hemostasis was obtained.  The arthrotomy was closed using a #1 stratofix running suture.  The deep soft tissues were closed with #0 vicryls and the subcuticular layer closed with #2-0 vicryl.  The skin was reapproximated and closed with 3.0 Monocryl.  The wound was covered with steristrips, aquacel dressing, and a TED stocking.   The patient was then awakened, extubated, and taken to the recovery room in stable condition.  BLOOD LOSS:  0000000 COMPLICATIONS:  None.  PLAN OF CARE: Admit for overnight observation  PATIENT DISPOSITION:  PACU - hemodynamically stable.    Please fax a copy of this op note to my office at [phone] (please only include page 1 and 2 of the Case Information op note)

## 2019-02-07 NOTE — Transfer of Care (Signed)
Immediate Anesthesia Transfer of Care Note  Patient: Marie Maynard  Procedure(s) Performed: Procedure(s): TOTAL KNEE ARTHROPLASTY (Right)  Patient Location: PACU  Anesthesia Type:General and block  Level of Consciousness: Patient easily awoken, sedated, comfortable, cooperative, following commands, responds to stimulation.   Airway & Oxygen Therapy: Patient spontaneously breathing, ventilating well, oxygen via simple oxygen mask.  Post-op Assessment: Report given to PACU RN, vital signs reviewed and stable, moving all extremities.   Post vital signs: Reviewed and stable.  Complications: No apparent anesthesia complications  Last Vitals:  Vitals Value Taken Time  BP 141/84 02/07/19 1127  Temp 36.8 C 02/07/19 1127  Pulse 101 02/07/19 1136  Resp 18 02/07/19 1136  SpO2 99 % 02/07/19 1136  Vitals shown include unvalidated device data.  Last Pain:  Vitals:   02/07/19 1127  TempSrc:   PainSc: (P) 6          Complications: No apparent anesthesia complications

## 2019-02-07 NOTE — Anesthesia Preprocedure Evaluation (Addendum)
Anesthesia Evaluation  Patient identified by MRN, date of birth, ID band Patient awake    Reviewed: Allergy & Precautions, NPO status , Patient's Chart, lab work & pertinent test results  Airway Mallampati: II  TM Distance: >3 FB     Dental   Pulmonary    breath sounds clear to auscultation       Cardiovascular negative cardio ROS   Rhythm:Regular Rate:Normal     Neuro/Psych  Headaches, Anxiety    GI/Hepatic negative GI ROS, Neg liver ROS,   Endo/Other  Hypothyroidism   Renal/GU negative Renal ROS     Musculoskeletal  (+) Arthritis ,   Abdominal   Peds  Hematology   Anesthesia Other Findings   Reproductive/Obstetrics                             Anesthesia Physical Anesthesia Plan  ASA: III  Anesthesia Plan: General   Post-op Pain Management:    Induction: Intravenous  PONV Risk Score and Plan: 3 and Ondansetron and Midazolam  Airway Management Planned: Oral ETT  Additional Equipment:   Intra-op Plan:   Post-operative Plan: Possible Post-op intubation/ventilation  Informed Consent: I have reviewed the patients History and Physical, chart, labs and discussed the procedure including the risks, benefits and alternatives for the proposed anesthesia with the patient or authorized representative who has indicated his/her understanding and acceptance.     Dental advisory given  Plan Discussed with: Anesthesiologist and CRNA  Anesthesia Plan Comments:       Anesthesia Quick Evaluation

## 2019-02-07 NOTE — Progress Notes (Signed)
AssistedDr. Charlene Green with right, ultrasound guided, adductor canal block. Side rails up, monitors on throughout procedure. See vital signs in flow sheet. Tolerated Procedure well. ? ?

## 2019-02-07 NOTE — Anesthesia Procedure Notes (Signed)
Anesthesia Regional Block: Adductor canal block   Pre-Anesthetic Checklist: ,, timeout performed,,,,,,,,,,,,  Laterality: Right  Prep: chloraprep       Needles:  Injection technique: Single-shot      Additional Needles:   Procedures: Doppler guided,,,, ultrasound used (permanent image in chart),,,,  Narrative:  Start time: 02/07/2019 9:10 AM End time: 02/07/2019 9:25 AM Injection made incrementally with aspirations every 5 mL.  Performed by: Personally  Anesthesiologist: Belinda Block, MD

## 2019-02-07 NOTE — Evaluation (Signed)
Physical Therapy Evaluation Patient Details Name: Marie Maynard MRN: [id number] DOB: [birth year] Today's Date: 02/07/2019   History of Present Illness  s/p R TKA  Clinical Impression  Pt is s/p TKA resulting in the deficits listed below (see PT Problem List).  PT motivated, amb 30' however limited by pain. Anticipate much improvement once pain more controlled.   Pt will benefit from skilled PT to increase their independence and safety with mobility to allow discharge to the venue listed below.      Follow Up Recommendations Follow surgeon's recommendation for DC plan and follow-up therapies    Equipment Recommendations  None recommended by PT    Recommendations for Other Services       Precautions / Restrictions Precautions Precautions: Fall;Knee Restrictions Weight Bearing Restrictions: No Other Position/Activity Restrictions: WBAT      Mobility  Bed Mobility Overal bed mobility: Needs Assistance Bed Mobility: Supine to Sit     Supine to sit: Min guard     General bed mobility comments: incr time, min-guard for safety  Transfers Overall transfer level: Needs assistance Equipment used: Rolling walker (2 wheeled) Transfers: Sit to/from Stand Sit to Stand: Min guard;Min assist         General transfer comment: cues for hand placement and RLE position  Ambulation/Gait Ambulation/Gait assistance: Min assist;Min guard Gait Distance (Feet): 30 Feet Assistive device: Rolling walker (2 wheeled) Gait Pattern/deviations: Step-to pattern;Decreased stance time - right;Decreased weight shift to right     General Gait Details: cues for sequence and RW position, distance limited by pain  Stairs            Wheelchair Mobility    Modified Rankin (Stroke Patients Only)       Balance                                             Pertinent Vitals/Pain Pain Assessment: 0-10 Pain Score: 9  Pain Location: right knee Pain  Descriptors / Indicators: Grimacing;Sore Pain Intervention(s): Monitored during session;Limited activity within patient's tolerance;Premedicated before session;Repositioned;Ice applied    Home Living Family/patient expects to be discharged to:: Private residence Living Arrangements: Spouse/significant other Available Help at Discharge: Family Type of Home: House Home Access: Stairs to enter   Technical brewer of Steps: 2 Home Layout: One level Home Equipment: Environmental consultant - 2 wheels;Cane - single point;Bedside commode      Prior Function Level of Independence: Independent               Hand Dominance        Extremity/Trunk Assessment   Upper Extremity Assessment Upper Extremity Assessment: Overall WFL for tasks assessed    Lower Extremity Assessment Lower Extremity Assessment: RLE deficits/detail RLE Deficits / Details: ankle WFL, knee extension and hip flexion 2+/5, AAROM grossly 10 to 65 degrees       Communication   Communication: No difficulties  Cognition Arousal/Alertness: Awake/alert Behavior During Therapy: WFL for tasks assessed/performed Overall Cognitive Status: Within Functional Limits for tasks assessed                                        General Comments      Exercises Total Joint Exercises Ankle Circles/Pumps: AROM;Both;10 reps Quad Sets: AROM;Both;5 reps;Limitations Quad Sets Limitations: pain Heel Slides: AAROM;Right;5  reps;Limitations Heel Slides Limitations: pain   Assessment/Plan    PT Assessment Patient needs continued PT services  PT Problem List Decreased strength;Decreased range of motion;Decreased activity tolerance;Decreased mobility;Pain;Decreased knowledge of use of DME       PT Treatment Interventions DME instruction;Gait training;Therapeutic activities;Therapeutic exercise;Functional mobility training;Patient/family education;Stair training    PT Goals (Current goals can be found in the Care Plan  section)  Acute Rehab PT Goals PT Goal Formulation: With patient Time For Goal Achievement: 02/14/19 Potential to Achieve Goals: Good    Frequency Min 3X/week   Barriers to discharge        Co-evaluation               AM-PAC PT "6 Clicks" Mobility  Outcome Measure Help needed turning from your back to your side while in a flat bed without using bedrails?: A Little Help needed moving from lying on your back to sitting on the side of a flat bed without using bedrails?: A Little Help needed moving to and from a bed to a chair (including a wheelchair)?: A Little Help needed standing up from a chair using your arms (e.g., wheelchair or bedside chair)?: A Little Help needed to walk in hospital room?: A Little Help needed climbing 3-5 steps with a railing? : A Little 6 Click Score: 18    End of Session Equipment Utilized During Treatment: Gait belt Activity Tolerance: Patient tolerated treatment well Patient left: with call bell/phone within reach;in chair;with chair alarm set   PT Visit Diagnosis: Other abnormalities of gait and mobility (R26.89);Difficulty in walking, not elsewhere classified (R26.2)    Time: [id number] PT Time Calculation (min) (ACUTE ONLY): 21 min   Charges:   PT Evaluation $PT Eval Low Complexity: 1 Low          Kenyon Ana, PT  Pager: [phone] Acute Rehab Dept [facility]): [phone]   02/07/2019   [facility] 02/07/2019, 6:22 PM

## 2019-02-08 ENCOUNTER — Encounter (HOSPITAL_COMMUNITY): Payer: Self-pay | Admitting: Orthopedic Surgery

## 2019-02-08 DIAGNOSIS — M1711 Unilateral primary osteoarthritis, right knee: Secondary | ICD-10-CM | POA: Diagnosis not present

## 2019-02-08 LAB — BASIC METABOLIC PANEL
Anion gap: 8 (ref 5–15)
BUN: 13 mg/dL (ref 8–23)
CO2: 28 mmol/L (ref 22–32)
Calcium: 8.2 mg/dL — ABNORMAL LOW (ref 8.9–10.3)
Chloride: 105 mmol/L (ref 98–111)
Creatinine, Ser: 0.73 mg/dL (ref 0.44–1.00)
GFR calc Af Amer: >60 mL/min (ref >60)
GFR calc non Af Amer: >60 mL/min (ref >60)
Glucose, Bld: 120 mg/dL — ABNORMAL HIGH (ref 70–99)
Potassium: 4.1 mmol/L (ref 3.5–5.1)
Sodium: 141 mmol/L (ref 135–145)

## 2019-02-08 LAB — CBC
HCT: 29.7 % — ABNORMAL LOW (ref 36.0–46.0)
Hemoglobin: 9.5 g/dL — ABNORMAL LOW (ref 12.0–15.0)
MCH: 29.5 pg (ref 26.0–34.0)
MCHC: 32 g/dL (ref 30.0–36.0)
MCV: 92.2 fL (ref 80.0–100.0)
Platelets: 135 10*3/uL — ABNORMAL LOW (ref 150–400)
RBC: 3.22 MIL/uL — ABNORMAL LOW (ref 3.87–5.11)
RDW: 13.4 % (ref 11.5–15.5)
WBC: 6 10*3/uL (ref 4.0–10.5)
nRBC: 0 % (ref 0.0–0.2)

## 2019-02-08 MED ORDER — TIZANIDINE HCL 2 MG PO TABS: 2.0000 mg | ORAL_TABLET | Freq: Four times a day (QID) | ORAL | 0 refills | Status: DC | PRN

## 2019-02-08 MED ORDER — ASPIRIN 325 MG PO TBEC: 325.0000 mg | DELAYED_RELEASE_TABLET | Freq: Two times a day (BID) | ORAL | 0 refills | Status: DC

## 2019-02-08 MED ORDER — HYDROCODONE-ACETAMINOPHEN 5-325 MG PO TABS: 1.0000 | ORAL_TABLET | Freq: Four times a day (QID) | ORAL | 0 refills | Status: DC | PRN

## 2019-02-08 NOTE — Progress Notes (Signed)
Therapy Plan: Outpatient PT Has DME

## 2019-02-08 NOTE — Discharge Summary (Signed)
SPORTS MEDICINE & JOINT REPLACEMENT   Lara Mulch, MD   Carlyon Shadow, PA-C [address], [place], [place]  [address]                             [phone]  PATIENT ID: Marie Maynard        MRN:  [id number]          DOB/AGE: 68-Jun-1952 / 68 y.o.    DISCHARGE SUMMARY  ADMISSION DATE:    02/07/2019 DISCHARGE DATE:   02/08/2019   ADMISSION DIAGNOSIS: Primary Osteoarthritis Right Knee    DISCHARGE DIAGNOSIS:  Primary Osteoarthritis Right Knee    ADDITIONAL DIAGNOSIS: Active Problems:   S/P total knee replacement  Past Medical History:  Diagnosis Date  . Anxiety    post-menopausal   . Arthritis   . 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 Complication of anesthesia 10/31/2015   patient states received too much Phenergan during surgery and recovery and very hard to wake up! Does not want any Phenergan.  . H/O seasonal allergies   . Headache   . Hypothyroidism     PROCEDURE: Procedure(s): TOTAL KNEE ARTHROPLASTY on 02/07/2019  CONSULTS:    HISTORY:  See H&P in chart  HOSPITAL COURSE:  Samanntha Hoggatt is a 68 y.o. admitted on 02/07/2019 and found to have a diagnosis of Primary Osteoarthritis Right Knee.  After appropriate laboratory studies were obtained  they were taken to the operating room on 02/07/2019 and underwent Procedure(s): TOTAL KNEE ARTHROPLASTY.   They were given perioperative antibiotics:  Anti-infectives (From admission, onward)   Start     Dose/Rate Route Frequency Ordered Stop   02/07/19 2200  nitrofurantoin (MACRODANTIN) capsule 50 mg     50 mg Oral Daily at bedtime 02/07/19 1311     02/07/19 1600  ceFAZolin (ANCEF) IVPB 2g/100 mL premix     2 g 200 mL/hr over 30 Minutes Intravenous Every 6 hours 02/07/19 1311 02/07/19 2157   02/07/19 0830  ceFAZolin (ANCEF) IVPB 2g/100 mL premix     2 g 200 mL/hr over 30 Minutes Intravenous On call to O.R. 02/07/19 0818 02/07/19 0949   02/07/19 0821  ceFAZolin  (ANCEF) 2-4 GM/100ML-% IVPB    Note to Pharmacy: Charmayne Sheer   : cabinet override      02/07/19 0821 02/07/19 0949    .  Patient given tranexamic acid IV or topical and exparel intra-operatively.  Tolerated the procedure well.    POD# 1: Vital signs were stable.  Patient denied Chest pain, shortness of breath, or calf pain.  Patient was started on Aspirin twice daily at 8am.  Consults to PT, OT, and care management were made.  The patient was weight bearing as tolerated.  CPM was placed on the operative leg 0-90 degrees for 6-8 hours a day. When out of the CPM, patient was placed in the foam block to achieve full extension. Incentive spirometry was taught.  Dressing was changed.       POD #2, Continued  PT for ambulation and exercise program.  IV saline locked.  O2 discontinued.    The remainder of the hospital course was dedicated to ambulation and strengthening.   The patient was discharged on 1 Day Post-Op in  Good condition.  Blood products given:none  DIAGNOSTIC STUDIES: Recent vital signs:  Patient Vitals for the past 24 hrs:  BP Temp Temp  src Pulse Resp SpO2 Height Weight  02/08/19 0543 (!) 101/51 98.7 F (37.1 C) Oral 90 16 93 % - -  02/08/19 0207 (!) 96/45 - - 83 - - - -  02/08/19 0202 (!) 93/55 98.7 F (37.1 C) Oral 81 16 99 % - -  02/07/19 2128 (!) 99/54 (!) 97.4 F (36.3 C) Oral 76 17 97 % - -  02/07/19 1631 (!) 98/50 97.6 F (36.4 C) Oral 78 16 98 % - -  02/07/19 1522 111/70 - - 81 16 99 % - -  02/07/19 1411 112/66 - - 83 16 100 % - -  02/07/19 1303 125/71 97.7 F (36.5 C) - 92 16 99 % - -  02/07/19 1245 117/69 97.8 F (36.6 C) - 98 15 98 % - -  02/07/19 1230 117/71 - - 96 14 95 % - -  02/07/19 1215 116/75 - - (!) 101 18 96 % - -  02/07/19 1200 126/76 - - (!) 101 17 95 % - -  02/07/19 1145 128/79 - - 99 17 97 % - -  02/07/19 1130 - - - (!) 102 20 100 % - -  02/07/19 1127 (!) 141/84 98.3 F (36.8 C) - (!) 101 17 100 % - -  02/07/19 0922 100/64 - - 62 18  100 % - -  02/07/19 0919 - - - 67 10 98 % - -  02/07/19 0918 - - - 65 (!) 9 99 % - -  02/07/19 0917 98/65 - - 63 10 100 % - -  02/07/19 0916 - - - 64 18 100 % - -  02/07/19 0915 - - - 65 17 100 % - -  02/07/19 0914 - - - 66 14 100 % - -  02/07/19 0913 - - - 69 16 100 % - -  02/07/19 0912 105/68 - - 62 15 100 % - -  02/07/19 0911 - - - - 16 - - -  02/07/19 0910 115/70 - - - 14 (P) 100 % - -  02/07/19 0822 - - - - - - 5\' 4"  (1.626 m) 67.6 kg  02/07/19 0800 107/66 98.5 F (36.9 C) Oral 77 16 98 % - -       Recent laboratory studies: Recent Labs    02/03/19 1349 02/07/19 1133 02/08/19 0306  WBC 4.7  --  6.0  HGB 13.7 11.6* 9.5*  HCT 43.5 34.0* 29.7*  PLT 196  --  135*   Recent Labs    02/03/19 1349 02/07/19 1133 02/08/19 0306  NA 141 142 141  K 4.4 3.6 4.1  CL 103 101 105  CO2 30  --  28  BUN 15 13 13   CREATININE 0.72 0.60 0.73  GLUCOSE 93 113* 120*  CALCIUM 8.8*  --  8.2*   Lab Results  Component Value Date   INR 1.02 04/29/2016     Recent Radiographic Studies :  No results found.  DISCHARGE INSTRUCTIONS: Discharge Instructions    Call MD / Call 911   Complete by: As directed    If you experience chest pain or shortness of breath, CALL 911 and be transported to the hospital emergency room.  If you develope a fever above 101 F, pus (white drainage) or increased drainage or redness at the wound, or calf pain, call your surgeon's office.   Constipation Prevention   Complete by: As directed    Drink plenty of fluids.  Prune juice may be helpful.  You  may use a stool softener, such as Colace (over the counter) 100 mg twice a day.  Use MiraLax (over the counter) for constipation as needed.   Diet - low sodium heart healthy   Complete by: As directed    Discharge instructions   Complete by: As directed    INSTRUCTIONS AFTER JOINT REPLACEMENT   Remove items at home which could result in a fall. This includes throw rugs or furniture in walking pathways ICE to the  affected joint every three hours while awake for 30 minutes at a time, for at least the first 3-5 days, and then as needed for pain and swelling.  Continue to use ice for pain and swelling. You may notice swelling that will progress down to the foot and ankle.  This is normal after surgery.  Elevate your leg when you are not up walking on it.   Continue to use the breathing machine you got in the hospital (incentive spirometer) which will help keep your temperature down.  It is common for your temperature to cycle up and down following surgery, especially at night when you are not up moving around and exerting yourself.  The breathing machine keeps your lungs expanded and your temperature down.   DIET:  As you were doing prior to hospitalization, we recommend a well-balanced diet.  DRESSING / WOUND CARE / SHOWERING  Keep the surgical dressing until follow up.  The dressing is water proof, so you can shower without any extra covering.  IF THE DRESSING FALLS OFF or the wound gets wet inside, change the dressing with sterile gauze.  Please use good hand washing techniques before changing the dressing.  Do not use any lotions or creams on the incision until instructed by your surgeon.    ACTIVITY  Increase activity slowly as tolerated, but follow the weight bearing instructions below.   No driving for 6 weeks or until further direction given by your physician.  You cannot drive while taking narcotics.  No lifting or carrying greater than 10 lbs. until further directed by your surgeon. Avoid periods of inactivity such as sitting longer than an hour when not asleep. This helps prevent blood clots.  You may return to work once you are authorized by your doctor.     WEIGHT BEARING   Weight bearing as tolerated with assist device (walker, cane, etc) as directed, use it as long as suggested by your surgeon or therapist, typically at least 4-6 weeks.   EXERCISES  Results after joint replacement  surgery are often greatly improved when you follow the exercise, range of motion and muscle strengthening exercises prescribed by your doctor. Safety measures are also important to protect the joint from further injury. Any time any of these exercises cause you to have increased pain or swelling, decrease what you are doing until you are comfortable again and then slowly increase them. If you have problems or questions, call your caregiver or physical therapist for advice.   Rehabilitation is important following a joint replacement. After just a few days of immobilization, the muscles of the leg can become weakened and shrink (atrophy).  These exercises are designed to build up the tone and strength of the thigh and leg muscles and to improve motion. Often times heat used for twenty to thirty minutes before working out will loosen up your tissues and help with improving the range of motion but do not use heat for the first two weeks following surgery (sometimes heat can increase post-operative  swelling).   These exercises can be done on a training (exercise) mat, on the floor, on a table or on a bed. Use whatever works the best and is most comfortable for you.    Use music or television while you are exercising so that the exercises are a pleasant break in your day. This will make your life better with the exercises acting as a break in your routine that you can look forward to.   Perform all exercises about fifteen times, three times per day or as directed.  You should exercise both the operative leg and the other leg as well.   Exercises include:   Quad Sets - Tighten up the muscle on the front of the thigh (Quad) and hold for 5-10 seconds.   Straight Leg Raises - With your knee straight (if you were given a brace, keep it on), lift the leg to 60 degrees, hold for 3 seconds, and slowly lower the leg.  Perform this exercise against resistance later as your leg gets stronger.  Leg Slides: Lying on your  back, slowly slide your foot toward your buttocks, bending your knee up off the floor (only go as far as is comfortable). Then slowly slide your foot back down until your leg is flat on the floor again.  Angel Wings: Lying on your back spread your legs to the side as far apart as you can without causing discomfort.  Hamstring Strength:  Lying on your back, push your heel against the floor with your leg straight by tightening up the muscles of your buttocks.  Repeat, but this time bend your knee to a comfortable angle, and push your heel against the floor.  You may put a pillow under the heel to make it more comfortable if necessary.   A rehabilitation program following joint replacement surgery can speed recovery and prevent re-injury in the future due to weakened muscles. Contact your doctor or a physical therapist for more information on knee rehabilitation.    CONSTIPATION  Constipation is defined medically as fewer than three stools per week and severe constipation as less than one stool per week.  Even if you have a regular bowel pattern at home, your normal regimen is likely to be disrupted due to multiple reasons following surgery.  Combination of anesthesia, postoperative narcotics, change in appetite and fluid intake all can affect your bowels.   YOU MUST use at least one of the following options; they are listed in order of increasing strength to get the job done.  They are all available over the counter, and you may need to use some, POSSIBLY even all of these options:    Drink plenty of fluids (prune juice may be helpful) and high fiber foods Colace 100 mg by mouth twice a day  Senokot for constipation as directed and as needed Dulcolax (bisacodyl), take with full glass of water  Miralax (polyethylene glycol) once or twice a day as needed.  If you have tried all these things and are unable to have a bowel movement in the first 3-4 days after surgery call either your surgeon or your  primary doctor.    If you experience loose stools or diarrhea, hold the medications until you stool forms back up.  If your symptoms do not get better within 1 week or if they get worse, check with your doctor.  If you experience "the worst abdominal pain ever" or develop nausea or vomiting, please contact the office immediately for further recommendations  for treatment.   ITCHING:  If you experience itching with your medications, try taking only a single pain pill, or even half a pain pill at a time.  You can also use Benadryl over the counter for itching or also to help with sleep.   TED HOSE STOCKINGS:  Use stockings on both legs until for at least 2 weeks or as directed by physician office. They may be removed at night for sleeping.  MEDICATIONS:  See your medication summary on the "After Visit Summary" that nursing will review with you.  You may have some home medications which will be placed on hold until you complete the course of blood thinner medication.  It is important for you to complete the blood thinner medication as prescribed.  PRECAUTIONS:  If you experience chest pain or shortness of breath - call 911 immediately for transfer to the hospital emergency department.   If you develop a fever greater that 101 F, purulent drainage from wound, increased redness or drainage from wound, foul odor from the wound/dressing, or calf pain - CONTACT YOUR SURGEON.                                                   FOLLOW-UP APPOINTMENTS:  If you do not already have a post-op appointment, please call the office for an appointment to be seen by your surgeon.  Guidelines for how soon to be seen are listed in your "After Visit Summary", but are typically between 1-4 weeks after surgery.  OTHER INSTRUCTIONS:   Knee Replacement:  Do not place pillow under knee, focus on keeping the knee straight while resting. CPM instructions: 0-90 degrees, 2 hours in the morning, 2 hours in the afternoon, and 2 hours  in the evening. Place foam block, curve side up under heel at all times except when in CPM or when walking.  DO NOT modify, tear, cut, or change the foam block in any way.  MAKE SURE YOU:  Understand these instructions.  Get help right away if you are not doing well or get worse.    Thank you for letting us be a part of your medical care team.  It is a privilege we respect greatly.  We hope these instructions will help you stay on track for a fast and full recovery!   Increase activity slowly as tolerated   Complete by: As directed       DISCHARGE MEDICATIONS:   Allergies as of 02/08/2019      Reactions   Codeine Itching   Dilaudid [hydromorphone Hcl] Itching, Nausea And Vomiting, Anxiety   Neurontin [gabapentin] Other (See Comments)   Seeing double   Oxycodone Itching   Prednisone Anxiety   Celebrex [celecoxib]    Nausea and stomach pain    Ciprofloxacin Diarrhea   Leads to c-diff   Levaquin [levofloxacin] Itching, Other (See Comments)   Feeling tired   Morphine And Related Nausea And Vomiting   Nausea and stomach pain  Nausea and stomach pain    Promethazine Other (See Comments)   Can't wake up   Sulfamethoxazole-trimethoprim Diarrhea   Leads to c-diff   Tylenol With Codeine #3 [acetaminophen-codeine]    Skin itching, and Anxiety    Other Rash   Uncoded Allergy. Allergen: CHLORINE      Medication List    STOP taking these  medications   ibuprofen 200 MG tablet Commonly known as: ADVIL     TAKE these medications   Align 4 MG Caps Take 4 mg by mouth daily.   aspirin 325 MG EC tablet Take 1 tablet (325 mg total) by mouth 2 (two) times daily.   AZO COMPLETE FEMININE BALANCE PO Take 1 tablet by mouth daily.   AZO Cranberry Urinary Tract 250-60 MG Caps Generic drug: Cranberry-Vitamin C Take 2 tablets by mouth daily.   CAL-MAG-ZINC PO Take 1 tablet by mouth daily.   CINNAMON PO Take 1 capsule by mouth 2 (two) times daily.   ezetimibe 10 MG  tablet Commonly known as: ZETIA Take 10 mg by mouth daily.   famotidine 20 MG tablet Commonly known as: PEPCID Take 20 mg by mouth at bedtime.   HYDROcodone-acetaminophen 5-325 MG tablet Commonly known as: NORCO/VICODIN Take 1-2 tablets by mouth every 6 (six) hours as needed for moderate pain.   levothyroxine 50 MCG tablet Commonly known as: SYNTHROID Take 50 mcg by mouth daily before breakfast.   mometasone 50 MCG/ACT nasal spray Commonly known as: NASONEX Place 2 sprays into the nose daily as needed (for allergies).   multivitamin with minerals Tabs tablet Take 1 tablet by mouth daily.   nitrofurantoin 50 MG capsule Commonly known as: MACRODANTIN Take 50 mg by mouth at bedtime. For bladder   pregabalin 50 MG capsule Commonly known as: LYRICA Take 50 mg by mouth at bedtime.   sertraline 100 MG tablet Commonly known as: ZOLOFT Take 100 mg by mouth daily.   tiZANidine 2 MG tablet Commonly known as: ZANAFLEX Take 1 tablet (2 mg total) by mouth every 6 (six) hours as needed.   TURMERIC PO Take 1 capsule by mouth daily.   Vitamin D3 125 MCG (5000 UT) Tabs Take 5,000 Units by mouth daily.   cholecalciferol 1000 units tablet Commonly known as: VITAMIN D Take 1,000 Units by mouth daily.   vitamin E 400 UNIT capsule Take 400 Units by mouth daily.            Durable Medical Equipment  (From admission, onward)         Start     Ordered   02/07/19 1311  DME Walker rolling  Once    Question:  Patient needs a walker to treat with the following condition  Answer:  S/P total knee replacement   02/07/19 1311   02/07/19 1311  DME 3 n 1  Once     02/07/19 1311   02/07/19 1311  DME Bedside commode  Once    Question:  Patient needs a bedside commode to treat with the following condition  Answer:  S/P total knee replacement   02/07/19 1311          FOLLOW UP VISIT:    DISPOSITION: HOME VS. SNF  CONDITION:  Good   Donia Ast 02/08/2019, 7:16 AM

## 2019-02-08 NOTE — Progress Notes (Signed)
SPORTS MEDICINE AND JOINT REPLACEMENT  Lara Mulch, MD    Carlyon Shadow, PA-C [place], [place], [place]  [address]                             [phone]   PROGRESS NOTE  Subjective:  negative for Chest Pain  negative for Shortness of Breath  negative for Nausea/Vomiting   negative for Calf Pain  negative for Bowel Movement   Tolerating Diet: yes         Patient reports pain as 3 on 0-10 scale.    Objective: Vital signs in last 24 hours:    Patient Vitals for the past 24 hrs:  BP Temp Temp src Pulse Resp SpO2 Height Weight  02/08/19 0543 (!) 101/51 98.7 F (37.1 C) Oral 90 16 93 % - -  02/08/19 0207 (!) 96/45 - - 83 - - - -  02/08/19 0202 (!) 93/55 98.7 F (37.1 C) Oral 81 16 99 % - -  02/07/19 2128 (!) 99/54 (!) 97.4 F (36.3 C) Oral 76 17 97 % - -  02/07/19 1631 (!) 98/50 97.6 F (36.4 C) Oral 78 16 98 % - -  02/07/19 1522 111/70 - - 81 16 99 % - -  02/07/19 1411 112/66 - - 83 16 100 % - -  02/07/19 1303 125/71 97.7 F (36.5 C) - 92 16 99 % - -  02/07/19 1245 117/69 97.8 F (36.6 C) - 98 15 98 % - -  02/07/19 1230 117/71 - - 96 14 95 % - -  02/07/19 1215 116/75 - - (!) 101 18 96 % - -  02/07/19 1200 126/76 - - (!) 101 17 95 % - -  02/07/19 1145 128/79 - - 99 17 97 % - -  02/07/19 1130 - - - (!) 102 20 100 % - -  02/07/19 1127 (!) 141/84 98.3 F (36.8 C) - (!) 101 17 100 % - -  02/07/19 0922 100/64 - - 62 18 100 % - -  02/07/19 0919 - - - 67 10 98 % - -  02/07/19 0918 - - - 65 (!) 9 99 % - -  02/07/19 0917 98/65 - - 63 10 100 % - -  02/07/19 0916 - - - 64 18 100 % - -  02/07/19 0915 - - - 65 17 100 % - -  02/07/19 0914 - - - 66 14 100 % - -  02/07/19 0913 - - - 69 16 100 % - -  02/07/19 0912 105/68 - - 62 15 100 % - -  02/07/19 0911 - - - - 16 - - -  02/07/19 0910 115/70 - - - 14 (P) 100 % - -  02/07/19 0822 - - - - - - 5\' 4"  (1.626 m) 67.6 kg  02/07/19 0800 107/66 98.5 F (36.9 C) Oral 77 16 98 % - -    @flow {1959:LAST@   Intake/Output  from previous day:   11/23 0701 - 11/24 0700 In: 4060 [P.O.:960; I.V.:2600] Out: 2650 [Urine:2050]   Intake/Output this shift:   No intake/output data recorded.   Intake/Output      11/23 0701 - 11/24 0700 11/24 0701 - 11/25 0700   P.O. 960    I.V. (mL/kg) 2600 (38.5)    IV Piggyback 500    Total Intake(mL/kg) 4060 (60.1)    Urine (mL/kg/hr) 2050    Blood 600  Total Output 2650    Net +1410            LABORATORY DATA: Recent Labs    02/03/19 1349 02/07/19 1133 02/08/19 0306  WBC 4.7  --  6.0  HGB 13.7 11.6* 9.5*  HCT 43.5 34.0* 29.7*  PLT 196  --  135*   Recent Labs    02/03/19 1349 02/07/19 1133 02/08/19 0306  NA 141 142 141  K 4.4 3.6 4.1  CL 103 101 105  CO2 30  --  28  BUN 15 13 13   CREATININE 0.72 0.60 0.73  GLUCOSE 93 113* 120*  CALCIUM 8.8*  --  8.2*   Lab Results  Component Value Date   INR 1.02 04/29/2016    Examination:  General appearance: alert, cooperative and no distress Extremities: extremities normal, atraumatic, no cyanosis or edema  Wound Exam: clean, dry, intact   Drainage:  None: wound tissue dry  Motor Exam: Quadriceps and Hamstrings Intact  Sensory Exam: Superficial Peroneal, Deep Peroneal and Tibial normal   Assessment:    1 Day Post-Op  Procedure(s) (LRB): TOTAL KNEE ARTHROPLASTY (Right)  ADDITIONAL DIAGNOSIS:  Active Problems:   S/P total knee replacement     Plan: Physical Therapy as ordered Weight Bearing as Tolerated (WBAT)  DVT Prophylaxis:  Aspirin  DISCHARGE PLAN: Home      Patient doing well, expect D/C home today  Patient's anticipated LOS is less than 2 midnights, meeting these requirements: - Younger than 21 - Lives within 1 hour of care - Has a competent adult at home to recover with post-op recover - NO history of  - Chronic pain requiring opiods  - Diabetes  - Coronary Artery Disease  - Heart failure  - Heart attack  - Stroke  - DVT/VTE  - Cardiac arrhythmia  - Respiratory  Failure/COPD  - Renal failure  - Anemia  - Advanced Liver disease        Donia Ast 02/08/2019, 7:12 AM

## 2019-02-08 NOTE — Progress Notes (Signed)
Physical Therapy Treatment Patient Details Name: Marie Maynard MRN: [id number] DOB: Aug 13, 1950 Today's Date: 02/08/2019    History of Present Illness s/p R TKA    PT Comments    Pt progressing well. Reviewed stairs, gait, home safety. Will see for a second session this pm and pt should be ready for d/c this pm.   Follow Up Recommendations  Follow surgeon's recommendation for DC plan and follow-up therapies     Equipment Recommendations  None recommended by PT    Recommendations for Other Services       Precautions / Restrictions Precautions Precautions: Fall;Knee Restrictions Weight Bearing Restrictions: No Other Position/Activity Restrictions: WBAT    Mobility  Bed Mobility Overal bed mobility: Needs Assistance Bed Mobility: Supine to Sit     Supine to sit: Supervision     General bed mobility comments: for safety  Transfers Overall transfer level: Needs assistance Equipment used: Rolling walker (2 wheeled) Transfers: Sit to/from Stand Sit to Stand: Supervision;Min guard         General transfer comment: cues for hand placement and RLE position  Ambulation/Gait Ambulation/Gait assistance: Supervision;Min guard Gait Distance (Feet): 140 Feet Assistive device: Rolling walker (2 wheeled) Gait Pattern/deviations: Step-to pattern;Decreased stance time - right;Decreased weight shift to right     General Gait Details: cues for sequence and RW position   Stairs Stairs: Yes Stairs assistance: Min guard;Min assist Stair Management: No rails;Step to pattern;Forwards;With walker Number of Stairs: 1 General stair comments: cues for  technique and sequence    Wheelchair Mobility    Modified Rankin (Stroke Patients Only)       Balance                                            Cognition Arousal/Alertness: Awake/alert Behavior During Therapy: WFL for tasks assessed/performed Overall Cognitive Status: Within Functional  Limits for tasks assessed                                        Exercises Total Joint Exercises Ankle Circles/Pumps: AROM;Both;10 reps    General Comments        Pertinent Vitals/Pain Pain Assessment: 0-10 Pain Score: 4  Pain Location: right knee Pain Descriptors / Indicators: Grimacing;Sore Pain Intervention(s): Limited activity within patient's tolerance;Monitored during session;Premedicated before session;Repositioned;Ice applied    Home Living                      Prior Function            PT Goals (current goals can now be found in the care plan section) Acute Rehab PT Goals PT Goal Formulation: With patient Time For Goal Achievement: 02/14/19 Potential to Achieve Goals: Good Progress towards PT goals: Progressing toward goals    Frequency    7X/week      PT Plan Current plan remains appropriate    Co-evaluation              AM-PAC PT "6 Clicks" Mobility   Outcome Measure  Help needed turning from your back to your side while in a flat bed without using bedrails?: A Little Help needed moving from lying on your back to sitting on the side of a flat bed without using bedrails?: A Little Help needed moving  to and from a bed to a chair (including a wheelchair)?: A Little Help needed standing up from a chair using your arms (e.g., wheelchair or bedside chair)?: A Little Help needed to walk in hospital room?: A Little Help needed climbing 3-5 steps with a railing? : A Little 6 Click Score: 18    End of Session Equipment Utilized During Treatment: Gait belt Activity Tolerance: Patient tolerated treatment well Patient left: in chair;with call bell/phone within reach;with chair alarm set   PT Visit Diagnosis: Other abnormalities of gait and mobility (R26.89);Difficulty in walking, not elsewhere classified (R26.2)     Time: [id number] PT Time Calculation (min) (ACUTE ONLY): 18 min  Charges:  $Gait Training: 8-22 mins                      Kenyon Ana, PT  Pager: [phone] Acute Rehab Dept [facility]): [phone]   02/08/2019    [facility] 02/08/2019, 12:40 PM

## 2019-02-08 NOTE — Progress Notes (Signed)
   02/08/19 1400  PT Visit Information  Last PT Received On 02/08/19  Exercises focused session, reviewed full TKA HEP. Pt tolerated well. Husband present. Ready for d/c from PT standpoint  Assistance Needed +1  History of Present Illness s/p R TKA  Precautions  Precautions Fall;Knee  Restrictions  Weight Bearing Restrictions No  Other Position/Activity Restrictions WBAT  Pain Assessment  Pain Assessment 0-10  Pain Score 3  Pain Location right knee  Pain Descriptors / Indicators Grimacing;Sore  Pain Intervention(s) Limited activity within patient's tolerance;Monitored during session;Premedicated before session;Repositioned  Cognition  Arousal/Alertness Awake/alert  Behavior During Therapy WFL for tasks assessed/performed  Overall Cognitive Status Within Functional Limits for tasks assessed  Bed Mobility  General bed mobility comments in chair   Total Joint Exercises  Ankle Circles/Pumps AROM;Both;10 reps  Quad Sets AROM;Both;10 reps  Heel Slides AAROM;Right;AROM;10 reps  Short [place] [employer];Right;10 reps  Hip ABduction/ADduction AROM;Right;10 reps  Straight Leg Raises AROM;Right;10 reps;AAROM  Knee Flexion AROM;Right;10 reps;Seated  Goniometric ROM grossly 5 to 90 degrees flexion AAROM right knee  PT - End of Session  Equipment Utilized During Treatment Gait belt  Activity Tolerance Patient tolerated treatment well  Patient left in chair;with call bell/phone within reach;with chair alarm set   PT - Assessment/Plan  PT Plan Current plan remains appropriate  PT Visit Diagnosis Other abnormalities of gait and mobility (R26.89);Difficulty in walking, not elsewhere classified (R26.2)  PT Frequency (ACUTE ONLY) 7X/week  Follow Up Recommendations Follow surgeon's recommendation for DC plan and follow-up therapies  PT equipment None recommended by PT  AM-PAC PT "6 Clicks" Mobility Outcome Measure (Version 2)  Help needed turning from your back to your side while in a flat  bed without using bedrails? 3  Help needed moving from lying on your back to sitting on the side of a flat bed without using bedrails? 3  Help needed moving to and from a bed to a chair (including a wheelchair)? 3  Help needed standing up from a chair using your arms (e.g., wheelchair or bedside chair)? 3  Help needed to walk in hospital room? 3  Help needed climbing 3-5 steps with a railing?  3  6 Click Score 18  Consider Recommendation of Discharge To: Home with [facility]  Acute Rehab PT Goals  PT Goal Formulation With patient  Time For Goal Achievement 02/14/19  Potential to Achieve Goals Good  PT Time Calculation  PT Start Time (ACUTE ONLY) 1350  PT Stop Time (ACUTE ONLY) 1407  PT Time Calculation (min) (ACUTE ONLY) 17 min  PT General Charges  $$ ACUTE PT VISIT 1 Visit  PT Treatments  $Therapeutic Exercise 8-22 mins

## 2019-05-02 DIAGNOSIS — M7522 Bicipital tendinitis, left shoulder: Secondary | ICD-10-CM | POA: Insufficient documentation

## 2019-05-02 DIAGNOSIS — M5030 Other cervical disc degeneration, unspecified cervical region: Secondary | ICD-10-CM | POA: Insufficient documentation

## 2019-07-11 DIAGNOSIS — E782 Mixed hyperlipidemia: Secondary | ICD-10-CM | POA: Insufficient documentation

## 2019-07-11 DIAGNOSIS — E079 Disorder of thyroid, unspecified: Secondary | ICD-10-CM | POA: Insufficient documentation

## 2019-07-11 DIAGNOSIS — M791 Myalgia, unspecified site: Secondary | ICD-10-CM | POA: Insufficient documentation

## 2019-07-11 DIAGNOSIS — R5382 Chronic fatigue, unspecified: Secondary | ICD-10-CM | POA: Insufficient documentation

## 2019-07-11 DIAGNOSIS — F419 Anxiety disorder, unspecified: Secondary | ICD-10-CM | POA: Insufficient documentation

## 2019-07-11 DIAGNOSIS — E559 Vitamin D deficiency, unspecified: Secondary | ICD-10-CM | POA: Insufficient documentation

## 2020-05-28 DIAGNOSIS — G8929 Other chronic pain: Secondary | ICD-10-CM | POA: Insufficient documentation

## 2020-06-19 DIAGNOSIS — M25512 Pain in left shoulder: Secondary | ICD-10-CM | POA: Insufficient documentation

## 2020-07-11 DIAGNOSIS — Z0000 Encounter for general adult medical examination without abnormal findings: Secondary | ICD-10-CM | POA: Insufficient documentation

## 2020-07-11 DIAGNOSIS — E039 Hypothyroidism, unspecified: Secondary | ICD-10-CM | POA: Insufficient documentation

## 2020-07-11 DIAGNOSIS — K219 Gastro-esophageal reflux disease without esophagitis: Secondary | ICD-10-CM | POA: Insufficient documentation

## 2020-07-11 DIAGNOSIS — F3342 Major depressive disorder, recurrent, in full remission: Secondary | ICD-10-CM | POA: Insufficient documentation

## 2020-07-31 ENCOUNTER — Other Ambulatory Visit: Payer: Self-pay | Admitting: Orthopedic Surgery

## 2020-07-31 DIAGNOSIS — Z01811 Encounter for preprocedural respiratory examination: Secondary | ICD-10-CM

## 2020-08-16 DIAGNOSIS — Z01818 Encounter for other preprocedural examination: Secondary | ICD-10-CM | POA: Insufficient documentation

## 2020-08-16 NOTE — Patient Instructions (Addendum)
DUE TO COVID-19 ONLY ONE VISITOR IS ALLOWED TO COME WITH YOU AND STAY IN THE WAITING ROOM ONLY DURING PRE OP AND PROCEDURE DAY OF SURGERY. THE 1 VISITOR  MAY VISIT WITH YOU AFTER SURGERY IN YOUR PRIVATE ROOM DURING VISITING HOURS ONLY!  YOU NEED TO HAVE A COVID 19 TEST ON: 08/20/20 @ 2:00 PM , THIS TEST MUST BE DONE BEFORE SURGERY,  COVID TESTING SITE [place] [address], IT IS ON THE RIGHT GOING OUT [address] APPROXIMATELY  2 MINUTES PAST [employer] ON THE RIGHT. ONCE YOUR COVID TEST IS COMPLETED,  PLEASE BEGIN THE QUARANTINE INSTRUCTIONS AS OUTLINED IN YOUR HANDOUT.                Marie Maynard   Your procedure is scheduled on: 08/23/20   Report to [facility] Main  Entrance   Report to admitting at: 9:20 AM     Call this number if you have problems the morning of surgery [phone]    Remember: NO SOLID FOOD AFTER MIDNIGHT THE NIGHT PRIOR TO SURGERY. NOTHING BY MOUTH EXCEPT CLEAR LIQUIDS UNTIL: 8:50 AM . PLEASE FINISH ENSURE DRINK PER SURGEON ORDER  WHICH NEEDS TO BE COMPLETED AT : 8:50 AM.  CLEAR LIQUID DIET  Foods Allowed                                                                     Foods Excluded  Coffee and tea, regular and decaf                             liquids that you cannot  Plain Jell-O any favor except red or purple                                           see through such as: Fruit ices (not with fruit pulp)                                     milk, soups, orange juice  Iced Popsicles                                    All solid food Carbonated beverages, regular and diet                                    Cranberry, grape and apple juices Sports drinks like Gatorade Lightly seasoned clear broth or consume(fat free) Sugar, honey syrup  Sample Menu Breakfast                                Lunch  Supper Cranberry juice                    Beef broth                             Chicken broth Jell-O                                     Grape juice                           Apple juice Coffee or tea                        Jell-O                                      Popsicle                                                Coffee or tea                        Coffee or tea  _____________________________________________________________________  BRUSH YOUR TEETH MORNING OF SURGERY AND RINSE YOUR MOUTH OUT, NO CHEWING GUM CANDY OR MINTS.    Take these medicines the morning of surgery with A SIP OF WATER: sertraline,synthoid.                                You may not have any metal on your body including hair pins and              piercings  Do not wear jewelry, make-up, lotions, powders or perfumes, deodorant             Do not wear nail polish on your fingernails.  Do not shave  48 hours prior to surgery.    Do not bring valuables to the hospital. [place].  Contacts, dentures or bridgework may not be worn into surgery.  Leave suitcase in the car. After surgery it may be brought to your room.     Patients discharged the day of surgery will not be allowed to drive home. IF YOU ARE HAVING SURGERY AND GOING HOME THE SAME DAY, YOU MUST HAVE AN ADULT TO DRIVE YOU HOME AND BE WITH YOU FOR 24 HOURS. YOU MAY GO HOME BY TAXI OR UBER OR ORTHERWISE, BUT AN ADULT MUST ACCOMPANY YOU HOME AND STAY WITH YOU FOR 24 HOURS.  Name and phone number of your driver:  Special Instructions: N/A              Please read over the following fact sheets you were given: _____________________________________________________________________          [facility] - Preparing for Surgery Before surgery, you can play an important role.  Because skin is not sterile, your skin needs to be as free of germs as possible.  You  can reduce the number of germs on your skin by washing with CHG (chlorahexidine gluconate) soap before surgery.  CHG is an  antiseptic cleaner which kills germs and bonds with the skin to continue killing germs even after washing. Please DO NOT use if you have an allergy to CHG or antibacterial soaps.  If your skin becomes reddened/irritated stop using the CHG and inform your nurse when you arrive at Short Stay. Do not shave (including legs and underarms) for at least 48 hours prior to the first CHG shower.  You may shave your face/neck. Please follow these instructions carefully:  1.  Shower with CHG Soap the night before surgery and the  morning of Surgery.  2.  If you choose to wash your hair, wash your hair first as usual with your  normal  shampoo.  3.  After you shampoo, rinse your hair and body thoroughly to remove the  shampoo.                           4.  Use CHG as you would any other liquid soap.  You can apply chg directly  to the skin and wash                       Gently with a scrungie or clean washcloth.  5.  Apply the CHG Soap to your body ONLY FROM THE NECK DOWN.   Do not use on face/ open                           Wound or open sores. Avoid contact with eyes, ears mouth and genitals (private parts).                       Wash face,  Genitals (private parts) with your normal soap.             6.  Wash thoroughly, paying special attention to the area where your surgery  will be performed.  7.  Thoroughly rinse your body with warm water from the neck down.  8.  DO NOT shower/wash with your normal soap after using and rinsing off  the CHG Soap.                9.  Pat yourself dry with a clean towel.            10.  Wear clean pajamas.            11.  Place clean sheets on your bed the night of your first shower and do not  sleep with pets. Day of Surgery : Do not apply any lotions/deodorants the morning of surgery.  Please wear clean clothes to the hospital/surgery center.  FAILURE TO FOLLOW THESE INSTRUCTIONS MAY RESULT IN THE CANCELLATION OF YOUR SURGERY PATIENT  SIGNATURE_________________________________  NURSE SIGNATURE__________________________________  ________________________________________________________________________  [facility]- Preparing for Total Shoulder Arthroplasty    Before surgery, you can play an important role. Because skin is not sterile, your skin needs to be as free of germs as possible. You can reduce the number of germs on your skin by using the following products. . Benzoyl Peroxide Gel o Reduces the number of germs present on the skin o Applied twice a day to shoulder area starting two days before surgery    ==================================================================  Please follow these instructions carefully:  BENZOYL PEROXIDE  5% GEL  Please do not use if you have an allergy to benzoyl peroxide.   If your skin becomes reddened/irritated stop using the benzoyl peroxide.  Starting two days before surgery, apply as follows: 1. Apply benzoyl peroxide in the morning and at night. Apply after taking a shower. If you are not taking a shower clean entire shoulder front, back, and side along with the armpit with a clean wet washcloth.  2. Place a quarter-sized dollop on your shoulder and rub in thoroughly, making sure to cover the front, back, and side of your shoulder, along with the armpit.   2 days before ____ AM   ____ PM              1 day before ____ AM   ____ PM                         3. Do this twice a day for two days.  (Last application is the night before surgery, AFTER using the CHG soap as described below).  4. Do NOT apply benzoyl peroxide gel on the day of surgery.   Incentive Spirometer  An incentive spirometer is a tool that can help keep your lungs clear and active. This tool measures how well you are filling your lungs with each breath. Taking long deep breaths may help reverse or decrease the chance of developing breathing (pulmonary) problems (especially infection) following:  A long  period of time when you are unable to move or be active. BEFORE THE PROCEDURE   If the spirometer includes an indicator to show your best effort, your nurse or respiratory therapist will set it to a desired goal.  If possible, sit up straight or lean slightly forward. Try not to slouch.  Hold the incentive spirometer in an upright position. INSTRUCTIONS FOR USE  1. Sit on the edge of your bed if possible, or sit up as far as you can in bed or on a chair. 2. Hold the incentive spirometer in an upright position. 3. Breathe out normally. 4. Place the mouthpiece in your mouth and seal your lips tightly around it. 5. Breathe in slowly and as deeply as possible, raising the piston or the ball toward the top of the column. 6. Hold your breath for 3-5 seconds or for as long as possible. Allow the piston or ball to fall to the bottom of the column. 7. Remove the mouthpiece from your mouth and breathe out normally. 8. Rest for a few seconds and repeat Steps 1 through 7 at least 10 times every 1-2 hours when you are awake. Take your time and take a few normal breaths between deep breaths. 9. The spirometer may include an indicator to show your best effort. Use the indicator as a goal to work toward during each repetition. 10. After each set of 10 deep breaths, practice coughing to be sure your lungs are clear. If you have an incision (the cut made at the time of surgery), support your incision when coughing by placing a pillow or rolled up towels firmly against it. Once you are able to get out of bed, walk around indoors and cough well. You may stop using the incentive spirometer when instructed by your caregiver.  RISKS AND COMPLICATIONS  Take your time so you do not get dizzy or light-headed.  If you are in pain, you may need to take or ask for pain medication before doing incentive spirometry. It is  harder to take a deep breath if you are having pain. AFTER USE  Rest and breathe slowly and  easily.  It can be helpful to keep track of a log of your progress. Your caregiver can provide you with a simple table to help with this. If you are using the spirometer at home, follow these instructions: [place] IF:   You are having difficultly using the spirometer.  You have trouble using the spirometer as often as instructed.  Your pain medication is not giving enough relief while using the spirometer.  You develop fever of 100.5 F (38.1 C) or higher. SEEK IMMEDIATE MEDICAL CARE IF:   You cough up bloody sputum that had not been present before.  You develop fever of 102 F (38.9 C) or greater.  You develop worsening pain at or near the incision site. MAKE SURE YOU:   Understand these instructions.  Will watch your condition.  Will get help right away if you are not doing well or get worse. Document Released: 07/14/2006 Document Revised: 05/26/2011 Document Reviewed: 09/14/2006 [facility] Patient Information 2014 [place], [place].   ________________________________________________________________________

## 2020-08-17 ENCOUNTER — Encounter (INDEPENDENT_AMBULATORY_CARE_PROVIDER_SITE_OTHER): Payer: Self-pay

## 2020-08-17 ENCOUNTER — Encounter (HOSPITAL_COMMUNITY)
Admission: RE | Admit: 2020-08-17 | Discharge: 2020-08-17 | Disposition: A | Discharge status: Home / Self Care | Payer: Medicare PPO | Source: Ambulatory Visit | Attending: Orthopedic Surgery | Admitting: Orthopedic Surgery

## 2020-08-17 ENCOUNTER — Ambulatory Visit (HOSPITAL_COMMUNITY)
Admission: RE | Admit: 2020-08-17 | Discharge: 2020-08-17 | Disposition: A | Discharge status: Home / Self Care | Payer: Medicare PPO | Source: Ambulatory Visit | Attending: Orthopedic Surgery | Admitting: Orthopedic Surgery

## 2020-08-17 ENCOUNTER — Encounter (HOSPITAL_COMMUNITY): Payer: Self-pay

## 2020-08-17 ENCOUNTER — Other Ambulatory Visit: Payer: Self-pay

## 2020-08-17 DIAGNOSIS — Z01811 Encounter for preprocedural respiratory examination: Secondary | ICD-10-CM | POA: Insufficient documentation

## 2020-08-17 LAB — APTT: aPTT: 35 seconds (ref 24–36)

## 2020-08-17 LAB — CBC WITH DIFFERENTIAL/PLATELET
Abs Immature Granulocytes: 0.01 10*3/uL (ref 0.00–0.07)
Basophils Absolute: 0 10*3/uL (ref 0.0–0.1)
Basophils Relative: 1 %
Eosinophils Absolute: 0.1 10*3/uL (ref 0.0–0.5)
Eosinophils Relative: 1 %
HCT: 44.1 % (ref 36.0–46.0)
Hemoglobin: 14.4 g/dL (ref 12.0–15.0)
Immature Granulocytes: 0 %
Lymphocytes Relative: 22 %
Lymphs Abs: 1.2 10*3/uL (ref 0.7–4.0)
MCH: 29.1 pg (ref 26.0–34.0)
MCHC: 32.7 g/dL (ref 30.0–36.0)
MCV: 89.3 fL (ref 80.0–100.0)
Monocytes Absolute: 0.4 10*3/uL (ref 0.1–1.0)
Monocytes Relative: 7 %
Neutro Abs: 3.7 10*3/uL (ref 1.7–7.7)
Neutrophils Relative %: 69 %
Platelets: 198 10*3/uL (ref 150–400)
RBC: 4.94 MIL/uL (ref 3.87–5.11)
RDW: 13.5 % (ref 11.5–15.5)
WBC: 5.4 10*3/uL (ref 4.0–10.5)
nRBC: 0 % (ref 0.0–0.2)

## 2020-08-17 LAB — COMPREHENSIVE METABOLIC PANEL
ALT: 14 U/L (ref 0–44)
AST: 21 U/L (ref 15–41)
Albumin: 4.5 g/dL (ref 3.5–5.0)
Alkaline Phosphatase: 71 U/L (ref 38–126)
Anion gap: 6 (ref 5–15)
BUN: 17 mg/dL (ref 8–23)
CO2: 30 mmol/L (ref 22–32)
Calcium: 8.9 mg/dL (ref 8.9–10.3)
Chloride: 103 mmol/L (ref 98–111)
Creatinine, Ser: 0.76 mg/dL (ref 0.44–1.00)
GFR, Estimated: >60 mL/min (ref >60)
Glucose, Bld: 95 mg/dL (ref 70–99)
Potassium: 4 mmol/L (ref 3.5–5.1)
Sodium: 139 mmol/L (ref 135–145)
Total Bilirubin: 0.5 mg/dL (ref 0.3–1.2)
Total Protein: 7 g/dL (ref 6.5–8.1)

## 2020-08-17 LAB — URINALYSIS, ROUTINE W REFLEX MICROSCOPIC
Bilirubin Urine: NEGATIVE
Glucose, UA: NEGATIVE mg/dL
Hgb urine dipstick: NEGATIVE
Ketones, ur: NEGATIVE mg/dL
Nitrite: POSITIVE — AB
Protein, ur: NEGATIVE mg/dL
Specific Gravity, Urine: 1.013 (ref 1.005–1.030)
pH: 5 (ref 5.0–8.0)

## 2020-08-17 LAB — SURGICAL PCR SCREEN
MRSA, PCR: NEGATIVE
Staphylococcus aureus: NEGATIVE

## 2020-08-17 NOTE — Progress Notes (Signed)
COVID Vaccine Completed: Yes Date COVID Vaccine completed: 06/2020 COVID vaccine manufacturer:  Moderna     PCP - Dr. Janace Litten. LOV: 08/15/20 Cardiologist -   Chest x-ray -  EKG - 08/15/20 requested Stress Test -  ECHO -  Cardiac Cath -  Pacemaker/ICD device last checked:  Sleep Study -  CPAP -   Fasting Blood Sugar -  Checks Blood Sugar _____ times a day  Blood Thinner Instructions: Aspirin Instructions: Last Dose:  Anesthesia review:   Patient denies shortness of breath, fever, cough and chest pain at PAT appointment   Patient verbalized understanding of instructions that were given to them at the PAT appointment. Patient was also instructed that they will need to review over the PAT instructions again at home before surgery.

## 2020-08-20 ENCOUNTER — Other Ambulatory Visit (HOSPITAL_COMMUNITY)
Admission: RE | Admit: 2020-08-20 | Discharge: 2020-08-20 | Disposition: A | Discharge status: Home / Self Care | Payer: Medicare PPO | Source: Ambulatory Visit | Attending: Orthopedic Surgery | Admitting: Orthopedic Surgery

## 2020-08-20 DIAGNOSIS — Z01812 Encounter for preprocedural laboratory examination: Secondary | ICD-10-CM | POA: Diagnosis present

## 2020-08-20 DIAGNOSIS — Z20822 Contact with and (suspected) exposure to covid-19: Secondary | ICD-10-CM | POA: Diagnosis not present

## 2020-08-20 NOTE — Progress Notes (Signed)
Labs results: UA: positive Nitrite,trace: Leukocytes,rare: Bacteria. Hemoglobin: 9.5     HCT: 29.7 Platelets: 135

## 2020-08-21 LAB — SARS CORONAVIRUS 2 (TAT 6-24 HRS): SARS Coronavirus 2: NEGATIVE

## 2020-08-22 NOTE — Anesthesia Preprocedure Evaluation (Addendum)
Anesthesia Evaluation  Patient identified by MRN, date of birth, ID band Patient awake    Reviewed: Allergy & Precautions, NPO status , Patient's Chart, lab work & pertinent test results  Airway Mallampati: II  TM Distance: >3 FB Neck ROM: Full    Dental  (+) Teeth Intact   Pulmonary neg pulmonary ROS,    Pulmonary exam normal        Cardiovascular negative cardio ROS   Rhythm:Regular Rate:Normal     Neuro/Psych  Headaches, Anxiety    GI/Hepatic negative GI ROS, Neg liver ROS,   Endo/Other  Hypothyroidism   Renal/GU negative Renal ROS  negative genitourinary   Musculoskeletal  (+) Arthritis ,   Abdominal (+)  Abdomen: soft. Bowel sounds: normal.  Peds  Hematology negative hematology ROS (+)   Anesthesia Other Findings   Reproductive/Obstetrics                            Anesthesia Physical Anesthesia Plan  ASA: II  Anesthesia Plan: General and Regional   Post-op Pain Management:  Regional for Post-op pain   Induction:   PONV Risk Score and Plan: 3 and Ondansetron, Midazolam, Dexamethasone and Treatment may vary due to age or medical condition  Airway Management Planned: Mask and Oral ETT  Additional Equipment: None  Intra-op Plan:   Post-operative Plan: Extubation in OR  Informed Consent: I have reviewed the patients History and Physical, chart, labs and discussed the procedure including the risks, benefits and alternatives for the proposed anesthesia with the patient or authorized representative who has indicated his/her understanding and acceptance.     Dental advisory given  Plan Discussed with: CRNA  Anesthesia Plan Comments: (Lab Results      Component                Value               Date                      WBC                      5.4                 08/17/2020                HGB                      14.4                08/17/2020                HCT                       44.1                08/17/2020                MCV                      89.3                08/17/2020                PLT                      198  08/17/2020           Lab Results      Component                Value               Date                      NA                       139                 08/17/2020                K                        4.0                 08/17/2020                CO2                      30                  08/17/2020                GLUCOSE                  95                  08/17/2020                BUN                      17                  08/17/2020                CREATININE               0.76                08/17/2020                CALCIUM                  8.9                 08/17/2020                GFRNONAA                 >60                 08/17/2020                GFRAA                    >60                 02/08/2019          )       Anesthesia Quick Evaluation

## 2020-08-23 ENCOUNTER — Ambulatory Visit (HOSPITAL_COMMUNITY): Payer: Medicare PPO

## 2020-08-23 ENCOUNTER — Encounter (HOSPITAL_COMMUNITY)
Admission: RE | Disposition: A | Discharge status: Home / Self Care | Payer: Self-pay | Source: Home / Self Care | Attending: Orthopedic Surgery

## 2020-08-23 ENCOUNTER — Encounter (HOSPITAL_COMMUNITY): Payer: Self-pay | Admitting: Orthopedic Surgery

## 2020-08-23 ENCOUNTER — Ambulatory Visit (HOSPITAL_COMMUNITY): Payer: Medicare PPO | Admitting: Certified Registered Nurse Anesthetist

## 2020-08-23 ENCOUNTER — Observation Stay (HOSPITAL_COMMUNITY)
Admission: RE | Admit: 2020-08-23 | Discharge: 2020-08-24 | Disposition: A | Discharge status: Home / Self Care | Payer: Medicare PPO | Attending: Orthopedic Surgery | Admitting: Orthopedic Surgery

## 2020-08-23 ENCOUNTER — Other Ambulatory Visit: Payer: Self-pay

## 2020-08-23 DIAGNOSIS — Z7982 Long term (current) use of aspirin: Secondary | ICD-10-CM | POA: Insufficient documentation

## 2020-08-23 DIAGNOSIS — Z96651 Presence of right artificial knee joint: Secondary | ICD-10-CM | POA: Diagnosis not present

## 2020-08-23 DIAGNOSIS — Z85828 Personal history of other malignant neoplasm of skin: Secondary | ICD-10-CM | POA: Insufficient documentation

## 2020-08-23 DIAGNOSIS — Z79899 Other long term (current) drug therapy: Secondary | ICD-10-CM | POA: Diagnosis not present

## 2020-08-23 DIAGNOSIS — E039 Hypothyroidism, unspecified: Secondary | ICD-10-CM | POA: Insufficient documentation

## 2020-08-23 DIAGNOSIS — M19012 Primary osteoarthritis, left shoulder: Secondary | ICD-10-CM | POA: Diagnosis not present

## 2020-08-23 DIAGNOSIS — M75122 Complete rotator cuff tear or rupture of left shoulder, not specified as traumatic: Secondary | ICD-10-CM | POA: Insufficient documentation

## 2020-08-23 DIAGNOSIS — Z96612 Presence of left artificial shoulder joint: Secondary | ICD-10-CM

## 2020-08-23 HISTORY — PX: REVERSE SHOULDER ARTHROPLASTY: SHX5054

## 2020-08-23 LAB — TYPE AND SCREEN
ABO/RH(D): A POS
Antibody Screen: NEGATIVE

## 2020-08-23 SURGERY — ARTHROPLASTY, SHOULDER, TOTAL, REVERSE
Anesthesia: Regional | Site: Shoulder | Laterality: Left

## 2020-08-23 MED ORDER — FENTANYL CITRATE (PF) 100 MCG/2ML IJ SOLN
INTRAMUSCULAR | Status: AC
  Filled 2020-08-23: qty 2

## 2020-08-23 MED ORDER — HYDROMORPHONE HCL 1 MG/ML IJ SOLN
0.5000 mg | INTRAMUSCULAR | Status: DC | PRN
Start: 2020-08-23 — End: 2020-08-24

## 2020-08-23 MED ORDER — ROCURONIUM BROMIDE 10 MG/ML (PF) SYRINGE
PREFILLED_SYRINGE | INTRAVENOUS | Status: DC | PRN
  Administered 2020-08-23: 60 mg via INTRAVENOUS

## 2020-08-23 MED ORDER — ZOLPIDEM TARTRATE 5 MG PO TABS: 5.0000 mg | ORAL_TABLET | Freq: Every evening | ORAL | Status: DC | PRN

## 2020-08-23 MED ORDER — BISACODYL 5 MG PO TBEC
5.0000 mg | DELAYED_RELEASE_TABLET | Freq: Every day | ORAL | Status: DC | PRN
Start: 2020-08-23 — End: 2020-08-24

## 2020-08-23 MED ORDER — ONDANSETRON HCL 4 MG PO TABS: 4.0000 mg | ORAL_TABLET | Freq: Four times a day (QID) | ORAL | Status: DC | PRN

## 2020-08-23 MED ORDER — BUPIVACAINE LIPOSOME 1.3 % IJ SUSP
INTRAMUSCULAR | Status: DC | PRN
  Administered 2020-08-23: 10 mL via PERINEURAL

## 2020-08-23 MED ORDER — EZETIMIBE 10 MG PO TABS: 10.0000 mg | ORAL_TABLET | Freq: Every day | ORAL | Status: DC

## 2020-08-23 MED ORDER — METOCLOPRAMIDE HCL 5 MG PO TABS: 5.0000 mg | ORAL_TABLET | Freq: Three times a day (TID) | ORAL | Status: DC | PRN

## 2020-08-23 MED ORDER — EPHEDRINE SULFATE-NACL 50-0.9 MG/10ML-% IV SOSY
PREFILLED_SYRINGE | INTRAVENOUS | Status: DC | PRN
  Administered 2020-08-23 (×3): 10 mg via INTRAVENOUS

## 2020-08-23 MED ORDER — ALUM & MAG HYDROXIDE-SIMETH 200-200-20 MG/5ML PO SUSP: 30.0000 mL | ORAL | Status: DC | PRN

## 2020-08-23 MED ORDER — MENTHOL 3 MG MT LOZG: 1.0000 | LOZENGE | OROMUCOSAL | Status: DC | PRN

## 2020-08-23 MED ORDER — METHOCARBAMOL 500 MG PO TABS
500.0000 mg | ORAL_TABLET | Freq: Four times a day (QID) | ORAL | Status: DC | PRN
  Administered 2020-08-24: 500 mg via ORAL
  Filled 2020-08-23: qty 1

## 2020-08-23 MED ORDER — CHLORHEXIDINE GLUCONATE 0.12 % MT SOLN: 15.0000 mL | Freq: Once | OROMUCOSAL | Status: AC

## 2020-08-23 MED ORDER — LIDOCAINE 2% (20 MG/ML) 5 ML SYRINGE
INTRAMUSCULAR | Status: DC | PRN
  Administered 2020-08-23: 20 mg via INTRAVENOUS

## 2020-08-23 MED ORDER — PHENYLEPHRINE HCL-NACL 10-0.9 MG/250ML-% IV SOLN
INTRAVENOUS | Status: AC
  Filled 2020-08-23: qty 500

## 2020-08-23 MED ORDER — ROCURONIUM BROMIDE 10 MG/ML (PF) SYRINGE
PREFILLED_SYRINGE | INTRAVENOUS | Status: AC
  Filled 2020-08-23: qty 10

## 2020-08-23 MED ORDER — DEXAMETHASONE SODIUM PHOSPHATE 10 MG/ML IJ SOLN
INTRAMUSCULAR | Status: AC
  Filled 2020-08-23: qty 1

## 2020-08-23 MED ORDER — DEXAMETHASONE SODIUM PHOSPHATE 10 MG/ML IJ SOLN
INTRAMUSCULAR | Status: DC | PRN
  Administered 2020-08-23: 5 mg via INTRAVENOUS

## 2020-08-23 MED ORDER — CEFAZOLIN SODIUM-DEXTROSE 2-4 GM/100ML-% IV SOLN
2.0000 g | INTRAVENOUS | Status: AC
  Administered 2020-08-23: 2 g via INTRAVENOUS
  Filled 2020-08-23: qty 100

## 2020-08-23 MED ORDER — POLYETHYLENE GLYCOL 3350 17 G PO PACK: 17.0000 g | PACK | Freq: Every day | ORAL | Status: DC | PRN

## 2020-08-23 MED ORDER — ORAL CARE MOUTH RINSE
15.0000 mL | Freq: Once | OROMUCOSAL | Status: AC
  Administered 2020-08-23: 15 mL via OROMUCOSAL

## 2020-08-23 MED ORDER — DIPHENHYDRAMINE HCL 12.5 MG/5ML PO ELIX: 12.5000 mg | ORAL_SOLUTION | ORAL | Status: DC | PRN

## 2020-08-23 MED ORDER — PHENYLEPHRINE HCL-NACL 10-0.9 MG/250ML-% IV SOLN
INTRAVENOUS | Status: DC | PRN
  Administered 2020-08-23: 50 ug/min via INTRAVENOUS

## 2020-08-23 MED ORDER — OXYCODONE HCL 5 MG PO TABS: 10.0000 mg | ORAL_TABLET | ORAL | Status: DC | PRN

## 2020-08-23 MED ORDER — 0.9 % SODIUM CHLORIDE (POUR BTL) OPTIME
TOPICAL | Status: DC | PRN
  Administered 2020-08-23: 1000 mL

## 2020-08-23 MED ORDER — OXYCODONE HCL 5 MG PO TABS: 5.0000 mg | ORAL_TABLET | ORAL | Status: DC | PRN

## 2020-08-23 MED ORDER — WATER FOR IRRIGATION, STERILE IR SOLN
Status: DC | PRN
  Administered 2020-08-23: 2000 mL

## 2020-08-23 MED ORDER — LIDOCAINE 2% (20 MG/ML) 5 ML SYRINGE
INTRAMUSCULAR | Status: AC
  Filled 2020-08-23: qty 5

## 2020-08-23 MED ORDER — PROPOFOL 10 MG/ML IV BOLUS
INTRAVENOUS | Status: AC
  Filled 2020-08-23: qty 20

## 2020-08-23 MED ORDER — SUGAMMADEX SODIUM 200 MG/2ML IV SOLN
INTRAVENOUS | Status: DC | PRN
  Administered 2020-08-23: 200 mg via INTRAVENOUS

## 2020-08-23 MED ORDER — BUPIVACAINE HCL (PF) 0.5 % IJ SOLN
INTRAMUSCULAR | Status: DC | PRN
  Administered 2020-08-23: 15 mL via PERINEURAL

## 2020-08-23 MED ORDER — PREGABALIN 50 MG PO CAPS
50.0000 mg | ORAL_CAPSULE | Freq: Every evening | ORAL | Status: DC
  Administered 2020-08-23: 50 mg via ORAL
  Filled 2020-08-23: qty 1

## 2020-08-23 MED ORDER — MIDAZOLAM HCL 5 MG/5ML IJ SOLN
INTRAMUSCULAR | Status: DC | PRN
  Administered 2020-08-23 (×2): 1 mg via INTRAVENOUS

## 2020-08-23 MED ORDER — FLEET ENEMA 7-19 GM/118ML RE ENEM: 1.0000 | ENEMA | Freq: Once | RECTAL | Status: DC | PRN

## 2020-08-23 MED ORDER — MIDAZOLAM HCL 2 MG/2ML IJ SOLN
INTRAMUSCULAR | Status: AC
  Filled 2020-08-23: qty 2

## 2020-08-23 MED ORDER — ACETAMINOPHEN 10 MG/ML IV SOLN: 1000.0000 mg | Freq: Once | INTRAVENOUS | Status: DC | PRN

## 2020-08-23 MED ORDER — ONDANSETRON HCL 4 MG/2ML IJ SOLN
INTRAMUSCULAR | Status: AC
  Filled 2020-08-23: qty 2

## 2020-08-23 MED ORDER — METHOCARBAMOL 1000 MG/10ML IJ SOLN
500.0000 mg | Freq: Four times a day (QID) | INTRAVENOUS | Status: DC | PRN
  Filled 2020-08-23: qty 5

## 2020-08-23 MED ORDER — LEVOTHYROXINE SODIUM 50 MCG PO TABS
50.0000 ug | ORAL_TABLET | Freq: Every day | ORAL | Status: DC
  Administered 2020-08-24: 50 ug via ORAL
  Filled 2020-08-23: qty 1

## 2020-08-23 MED ORDER — FENTANYL CITRATE (PF) 100 MCG/2ML IJ SOLN: 25.0000 ug | INTRAMUSCULAR | Status: DC | PRN

## 2020-08-23 MED ORDER — ACETAMINOPHEN 500 MG PO TABS
1000.0000 mg | ORAL_TABLET | Freq: Four times a day (QID) | ORAL | Status: AC
  Administered 2020-08-23 – 2020-08-24 (×4): 1000 mg via ORAL
  Filled 2020-08-23 (×4): qty 2

## 2020-08-23 MED ORDER — FENTANYL CITRATE (PF) 100 MCG/2ML IJ SOLN
INTRAMUSCULAR | Status: DC | PRN
  Administered 2020-08-23 (×2): 50 ug via INTRAVENOUS

## 2020-08-23 MED ORDER — PHENOL 1.4 % MT LIQD: 1.0000 | OROMUCOSAL | Status: DC | PRN

## 2020-08-23 MED ORDER — EZETIMIBE 10 MG PO TABS
10.0000 mg | ORAL_TABLET | Freq: Every day | ORAL | Status: DC
  Administered 2020-08-23: 10 mg via ORAL
  Filled 2020-08-23 (×2): qty 1

## 2020-08-23 MED ORDER — ONDANSETRON HCL 4 MG/2ML IJ SOLN
INTRAMUSCULAR | Status: DC | PRN
  Administered 2020-08-23: 4 mg via INTRAVENOUS

## 2020-08-23 MED ORDER — SODIUM CHLORIDE 0.9 % IR SOLN
Status: DC | PRN
  Administered 2020-08-23: 1000 mL

## 2020-08-23 MED ORDER — TRANEXAMIC ACID-NACL 1000-0.7 MG/100ML-% IV SOLN
1000.0000 mg | INTRAVENOUS | Status: AC
  Administered 2020-08-23: 1000 mg via INTRAVENOUS
  Filled 2020-08-23: qty 100

## 2020-08-23 MED ORDER — LACTATED RINGERS IV SOLN: INTRAVENOUS | Status: DC

## 2020-08-23 MED ORDER — ONDANSETRON HCL 4 MG/2ML IJ SOLN: 4.0000 mg | Freq: Four times a day (QID) | INTRAMUSCULAR | Status: DC | PRN

## 2020-08-23 MED ORDER — AMISULPRIDE (ANTIEMETIC) 5 MG/2ML IV SOLN: 10.0000 mg | Freq: Once | INTRAVENOUS | Status: DC | PRN

## 2020-08-23 MED ORDER — ACETAMINOPHEN 325 MG PO TABS: 325.0000 mg | ORAL_TABLET | Freq: Four times a day (QID) | ORAL | Status: DC | PRN

## 2020-08-23 MED ORDER — PROPOFOL 10 MG/ML IV BOLUS
INTRAVENOUS | Status: DC | PRN
  Administered 2020-08-23: 100 mg via INTRAVENOUS

## 2020-08-23 MED ORDER — SODIUM CHLORIDE 0.9 % IV SOLN: INTRAVENOUS | Status: DC

## 2020-08-23 MED ORDER — ASPIRIN EC 325 MG PO TBEC
325.0000 mg | DELAYED_RELEASE_TABLET | Freq: Two times a day (BID) | ORAL | Status: DC
  Administered 2020-08-23: 325 mg via ORAL
  Filled 2020-08-23: qty 1

## 2020-08-23 MED ORDER — DOCUSATE SODIUM 100 MG PO CAPS
100.0000 mg | ORAL_CAPSULE | Freq: Two times a day (BID) | ORAL | Status: DC
  Administered 2020-08-23 – 2020-08-24 (×3): 100 mg via ORAL
  Filled 2020-08-23 (×3): qty 1

## 2020-08-23 MED ORDER — METOCLOPRAMIDE HCL 5 MG/ML IJ SOLN
5.0000 mg | Freq: Three times a day (TID) | INTRAMUSCULAR | Status: DC | PRN
Start: 2020-08-23 — End: 2020-08-24

## 2020-08-23 MED ORDER — FAMOTIDINE 20 MG PO TABS
20.0000 mg | ORAL_TABLET | Freq: Every day | ORAL | Status: DC
  Administered 2020-08-23: 20 mg via ORAL
  Filled 2020-08-23: qty 1

## 2020-08-23 MED ORDER — SERTRALINE HCL 100 MG PO TABS
100.0000 mg | ORAL_TABLET | Freq: Every day | ORAL | Status: DC
Start: 2020-08-24 — End: 2020-08-24
  Administered 2020-08-24: 100 mg via ORAL
  Filled 2020-08-23: qty 1

## 2020-08-23 SURGICAL SUPPLY — 80 items
AID PSTN UNV HD RSTRNT DISP (MISCELLANEOUS)
BAG SPEC THK2 15X12 ZIP CLS (MISCELLANEOUS) ×1
BAG ZIPLOCK 12X15 (MISCELLANEOUS) ×3 IMPLANT
BASEPLATE P2 COATD GLND 6.5X30 (Shoulder) IMPLANT
BIT DRILL 1.6MX128 (BIT) IMPLANT
BIT DRILL 1.6MX128MM (BIT)
BIT DRILL 2.5 DIA 127 CALI (BIT) ×2 IMPLANT
BIT DRILL 4 DIA CALIBRATED (BIT) ×2 IMPLANT
BLADE SAW SAG 73X25 THK (BLADE) ×2
BLADE SAW SGTL 73X25 THK (BLADE) ×1 IMPLANT
BOOTIES KNEE HIGH SLOAN (MISCELLANEOUS) ×8 IMPLANT
BSPLAT GLND 30 STRL LF SHLDR (Shoulder) ×1 IMPLANT
CLOSURE WOUND 1/2 X4 (GAUZE/BANDAGES/DRESSINGS) ×1
COOLER ICEMAN CLASSIC (MISCELLANEOUS) ×2 IMPLANT
COVER BACK TABLE 60X90IN (DRAPES) ×3 IMPLANT
COVER SURGICAL LIGHT HANDLE (MISCELLANEOUS) ×3 IMPLANT
COVER WAND RF STERILE (DRAPES) IMPLANT
DRAPE INCISE IOBAN 66X45 STRL (DRAPES) ×3 IMPLANT
DRAPE ORTHO SPLIT 77X108 STRL (DRAPES) ×6
DRAPE POUCH INSTRU U-SHP 10X18 (DRAPES) ×3 IMPLANT
DRAPE SHEET LG 3/4 BI-LAMINATE (DRAPES) ×3 IMPLANT
DRAPE SURG 17X11 SM STRL (DRAPES) ×3 IMPLANT
DRAPE SURG ORHT 6 SPLT 77X108 (DRAPES) ×2 IMPLANT
DRAPE TOP 10253 STERILE (DRAPES) ×3 IMPLANT
DRAPE U-SHAPE 47X51 STRL (DRAPES) ×3 IMPLANT
DRSG AQUACEL AG ADV 3.5X 6 (GAUZE/BANDAGES/DRESSINGS) ×3 IMPLANT
DURAPREP 26ML APPLICATOR (WOUND CARE) ×6 IMPLANT
ELECT BLADE TIP CTD 4 INCH (ELECTRODE) ×3 IMPLANT
ELECT REM PT RETURN 15FT ADLT (MISCELLANEOUS) ×3 IMPLANT
GLOVE SRG 8 PF TXTR STRL LF DI (GLOVE) ×1 IMPLANT
GLOVE SURG ENC MOIS LTX SZ7 (GLOVE) ×9 IMPLANT
GLOVE SURG ENC MOIS LTX SZ7.5 (GLOVE) ×9 IMPLANT
GLOVE SURG UNDER POLY LF SZ7 (GLOVE) ×3 IMPLANT
GLOVE SURG UNDER POLY LF SZ8 (GLOVE) ×3
GOWN STRL REUS W/TWL LRG LVL3 (GOWN DISPOSABLE) ×9 IMPLANT
GOWN STRL REUS W/TWL XL LVL3 (GOWN DISPOSABLE) ×3 IMPLANT
HANDPIECE INTERPULSE COAX TIP (DISPOSABLE) ×3
HOOD PEEL AWAY FLYTE STAYCOOL (MISCELLANEOUS) ×9 IMPLANT
HUMERA STEM SM SHELL SHOU 10 (Miscellaneous) ×3 IMPLANT
INSERT SMALL SOCKET 32MM (Insert) ×2 IMPLANT
KIT BASIN OR (CUSTOM PROCEDURE TRAY) ×3 IMPLANT
KIT TURNOVER KIT A (KITS) ×3 IMPLANT
MANIFOLD NEPTUNE II (INSTRUMENTS) ×3 IMPLANT
NDL TROCAR POINT SZ 2 1/2 (NEEDLE) IMPLANT
NEEDLE TROCAR POINT SZ 2 1/2 (NEEDLE) IMPLANT
NS IRRIG 1000ML POUR BTL (IV SOLUTION) ×3 IMPLANT
P2 COATDE GLNOID BSEPLT 6.5X30 (Shoulder) ×3 IMPLANT
PACK SHOULDER (CUSTOM PROCEDURE TRAY) ×3 IMPLANT
PAD COLD SHLDR WRAP-ON (PAD) IMPLANT
PROTECTOR NERVE ULNAR (MISCELLANEOUS) IMPLANT
RESTRAINT HEAD UNIVERSAL NS (MISCELLANEOUS) IMPLANT
RETRIEVER SUT HEWSON (MISCELLANEOUS) ×2 IMPLANT
SCREW BONE LOCKING RSP 5.0X14 (Screw) ×6 IMPLANT
SCREW BONE LOCKING RSP 5.0X30 (Screw) ×3 IMPLANT
SCREW BONE LOCKING RSP 5.0X34 (Screw) ×3 IMPLANT
SCREW BONE RSP LOCK 5X14 (Screw) IMPLANT
SCREW BONE RSP LOCK 5X30 (Screw) IMPLANT
SCREW BONE RSP LOCK 5X34 (Screw) IMPLANT
SCREW RETAIN W/HEAD 4MM OFFSET (Shoulder) ×2 IMPLANT
SET HNDPC FAN SPRY TIP SCT (DISPOSABLE) ×1 IMPLANT
SLING ARM FOAM STRAP MED (SOFTGOODS) ×2 IMPLANT
SLING ARM IMMOBILIZER LRG (SOFTGOODS) IMPLANT
SLING ARM IMMOBILIZER MED (SOFTGOODS) ×2 IMPLANT
SPONGE LAP 18X18 RF (DISPOSABLE) ×3 IMPLANT
STEM HUMERAL SM SHELL SHOU 10 (Miscellaneous) IMPLANT
STRIP CLOSURE SKIN 1/2X4 (GAUZE/BANDAGES/DRESSINGS) ×3 IMPLANT
SUCTION FRAZIER HANDLE 10FR (MISCELLANEOUS) ×3
SUCTION TUBE FRAZIER 10FR DISP (MISCELLANEOUS) IMPLANT
SUPPORT WRAP ARM LG (MISCELLANEOUS) IMPLANT
SUT ETHIBOND 2 V 37 (SUTURE) ×2 IMPLANT
SUT FIBERWIRE #2 38 REV NDL BL (SUTURE)
SUT MNCRL AB 4-0 PS2 18 (SUTURE) ×3 IMPLANT
SUT VIC AB 2-0 CT1 27 (SUTURE) ×3
SUT VIC AB 2-0 CT1 TAPERPNT 27 (SUTURE) ×1 IMPLANT
SUTURE FIBERWR#2 38 REV NDL BL (SUTURE) IMPLANT
TAPE LABRALWHITE 1.5X36 (TAPE) ×2 IMPLANT
TAPE SUT LABRALTAP WHT/BLK (SUTURE) ×2 IMPLANT
TOWEL OR 17X26 10 PK STRL BLUE (TOWEL DISPOSABLE) ×3 IMPLANT
TOWEL OR NON WOVEN STRL DISP B (DISPOSABLE) ×3 IMPLANT
WATER STERILE IRR 1000ML POUR (IV SOLUTION) ×3 IMPLANT

## 2020-08-23 NOTE — Anesthesia Procedure Notes (Addendum)
Anesthesia Regional Block: Interscalene brachial plexus block   Pre-Anesthetic Checklist: , timeout performed,  Correct Patient, Correct Site, Correct Laterality,  Correct Procedure, Correct Position, site marked,  Risks and benefits discussed,  Surgical consent,  Pre-op evaluation,  At surgeon's request and post-op pain management  Laterality: Left  Prep: Dura Prep       Needles:  Injection technique: Single-shot  Needle Type: Echogenic Stimulator Needle     Needle Length: 5cm  Needle Gauge: 20     Additional Needles:   Procedures:,,,, ultrasound used (permanent image in chart),,    Narrative:  Start time: 08/23/2020 6:58 AM End time: 08/23/2020 7:01 AM Injection made incrementally with aspirations every 5 mL.  Performed by: Personally  Anesthesiologist: Darral Dash, DO  Additional Notes: Patient identified. Risks/Benefits/Options discussed with patient including but not limited to bleeding, infection, nerve damage, failed block, incomplete pain control. Patient expressed understanding and wished to proceed. All questions were answered. Sterile technique was used throughout the entire procedure. Please see nursing notes for vital signs. Aspirated in 5cc intervals with injection for negative confirmation. Patient was given instructions on fall risk and not to get out of bed. All questions and concerns addressed with instructions to call with any issues or inadequate analgesia.

## 2020-08-23 NOTE — Plan of Care (Signed)
  Problem: Nutrition: Goal: Adequate nutrition will be maintained Outcome: Progressing   Problem: Elimination: Goal: Will not experience complications related to urinary retention Outcome: Progressing   Problem: Pain Managment: Goal: General experience of comfort will improve Outcome: Progressing   Problem: Safety: Goal: Ability to remain free from injury will improve Outcome: Progressing   

## 2020-08-23 NOTE — Discharge Instructions (Signed)

## 2020-08-23 NOTE — Transfer of Care (Signed)
Immediate Anesthesia Transfer of Care Note  Patient: Marie Maynard  Procedure(s) Performed: REVERSE SHOULDER ARTHROPLASTY (Left: Shoulder)  Patient Location: PACU  Anesthesia Type:GA combined with regional for post-op pain  Level of Consciousness: awake, alert  and patient cooperative  Airway & Oxygen Therapy: Patient Spontanous Breathing and Patient connected to face mask oxygen  Post-op Assessment: Report given to RN and Post -op Vital signs reviewed and stable  Post vital signs: Reviewed and stable  Last Vitals:  Vitals Value Taken Time  BP 142/83 08/23/20 0908  Temp    Pulse 91 08/23/20 0912  Resp 16 08/23/20 0912  SpO2 97 % 08/23/20 0912  Vitals shown include unvalidated device data.  Last Pain:  Vitals:   08/23/20 0617  PainSc: 7       Patients Stated Pain Goal: 4 (63/84/53 6468)  Complications: No notable events documented.

## 2020-08-23 NOTE — Op Note (Signed)
Procedure(s): REVERSE SHOULDER ARTHROPLASTY Procedure Note  Marie Maynard female 70 y.o. 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 female  With endstage left shoulder arthritis with irrepairable rotator cuff tear. Pain and dysfunction interfered with quality of life and nonoperative treatment with activity modification, NSAIDS and injections failed.     Surgeon: Isabella Stalling   Assistants: Sheryle Hail PA-C ([address] was present and scrubbed throughout the procedure and was essential in positioning, retraction, exposure, and closure).  Verdie Mosher PA-C was also scrubbed and assisted throughout the procedure  Anesthesia: General endotracheal anesthesia    Procedure Detail  REVERSE SHOULDER ARTHROPLASTY   Estimated Blood Loss:  200 mL         Drains: none  Blood Given: none          Specimens: none        Complications:  * No complications entered in OR log *         Disposition: PACU - hemodynamically stable.         Condition: stable      OPERATIVE FINDINGS:  A DJO Altivate pressfit reverse total shoulder arthroplasty was placed with a  size 10 stem, a 32-4 glenosphere, and a +4-mm poly insert. The base plate  fixation was excellent.  PROCEDURE: The patient was identified in the preoperative holding area  where I personally marked the operative site after verifying site, side,  and procedure with the patient. An interscalene block given by  the attending anesthesiologist in the holding area and the patient was taken back to the operating room where all extremities were  carefully padded in position after general anesthesia was induced. She  was placed in a beach-chair position and the operative upper extremity was  prepped and draped in  a standard sterile fashion. An approximately 10-  cm incision was made from the tip of the coracoid process to the center  point of the humerus at the level of the axilla. Dissection was carried  down through subcutaneous tissues to the level of the cephalic vein  which was taken laterally with the deltoid. The pectoralis major was  retracted medially. The subdeltoid space was developed and the lateral  edge of the conjoined tendon was identified. The undersurface of  conjoined tendon was palpated and the musculocutaneous nerve was not in  the field. Retractor was placed underneath the conjoined and second  retractor was placed lateral into the deltoid. The circumflex humeral  artery and vessels were identified and clamped and coagulated. The  biceps tendon was tenodesed to the upper border of the pectoralis major.  The subscapularis was taken down as a peel with the underlying capsule.  The  joint was then gently externally rotated while the capsule was released  from the humeral neck around to just beyond the 6 o'clock position. At  this point, the joint was dislocated and the humeral head was presented  into the wound. The excessive osteophyte formation was removed with a  large rongeur.  The cutting guide was used to make the appropriate  head cut and the head was saved for potentially bone grafting.  The glenoid was exposed with the arm in an  abducted extended position. The anterior and posterior labrum were  completely excised and the capsule was released circumferentially to  allow for  exposure of the glenoid for preparation. The 2.5 mm drill was  placed using the guide in 5-10 inferior angulation and the tap was then advanced in the same hole. Small and large reamers were then used. The tap was then removed and the Metaglene was then screwed in with excellent purchase.  The peripheral guide was then used to drilled measured and filled peripheral locking screws. The size 32-4  glenosphere was then impacted on the [facility] taper and the central screw was placed. The humerus was then again exposed and the diaphyseal reamers were used followed by the metaphyseal reamers. The final broach was left in place in the proximal trial was placed. The joint was reduced and with this implant it was felt that soft tissue tensioning was appropriate with excellent stability and excellent range of motion. Therefore, final humeral stem was placed press-fit.  And then the trial polyethylene inserts were tested again and the above implant was felt to be the most appropriate for final insertion. The joint was reduced taken through full range of motion and felt to be stable. Soft tissue tension was appropriate.  The joint was then copiously irrigated with pulse  lavage and the wound was then closed. The subscapularis was repaired with 2 labral tapes passed through bone tunnels around the implant.  Skin was closed with 2-0 Vicryl in a deep dermal layer and 4-0  Monocryl for skin closure. Steri-Strips were applied. Sterile  dressings were then applied as well as a sling. The patient was allowed  to awaken from general anesthesia, transferred to stretcher, and taken  to recovery room in stable condition.   POSTOPERATIVE PLAN: The patient will be kept in the hospital postoperatively overnight for observation, and pain control.

## 2020-08-23 NOTE — H&P (Signed)
Marie Maynard is an 70 y.o. female.   Chief Complaint: L shoulder pain and dysfunction HPI: Endstage L shoulder arthritis with advanced rotator cuff disease significant pain and dysfunction, failed conservative measures.  Pain interferes with sleep and quality of life.   Past Medical History:  Diagnosis Date   Anxiety    post-menopausal    Arthritis    Cancer ([place])    basal cell skin cancer removed from neck    Complication of anesthesia 10/31/2015   patient states received too much Phenergan during surgery and recovery and very hard to wake up! Does not want any Phenergan.   H/O seasonal allergies    Headache    Hypothyroidism     Past Surgical History:  Procedure Laterality Date   ABDOMINAL HYSTERECTOMY     partial    APPENDECTOMY     BREAST BIOPSY     left    CARPAL TUNNEL RELEASE     left    CHOLECYSTECTOMY  2005   CYSTOCELE REPAIR     goiter surgery  2005   jaw arthrocentesis     KNEE ARTHROSCOPY WITH LATERAL MENISECTOMY Right 10/31/2015   Procedure: RIGHT KNEE ARTHROSCOPY WITH MENISCAL DEBRIDEMENT;  Surgeon: Gaynelle Arabian, MD;  Location: [facility];  Service: Orthopedics;  Laterality: Right;   lumbar injections      x 2   spinal radiofrequency ablation      x 3   TONSILLECTOMY     TOTAL KNEE ARTHROPLASTY Right 02/07/2019   Procedure: TOTAL KNEE ARTHROPLASTY;  Surgeon: Vickey Huger, MD;  Location: [facility];  Service: Orthopedics;  Laterality: Right;    Family History  Problem Relation Age of Onset   COPD Mother    Pancreatic cancer Father    Pulmonary Hypertension Sister    Congenital heart disease Brother    Social History:  reports that she has never smoked. She has never used smokeless tobacco. She reports that she does not drink alcohol and does not use drugs.  Allergies:  Allergies  Allergen Reactions   Codeine Itching   Dilaudid [Hydromorphone Hcl] Itching, Nausea And Vomiting and Anxiety   Neurontin [Gabapentin] Other (See Comments)    Seeing  double   Oxycodone Itching   Prednisone Anxiety   Celebrex [Celecoxib] Nausea Only    Nausea and stomach pain    Ciprofloxacin Diarrhea    Leads to c-diff   Levaquin [Levofloxacin] Itching and Other (See Comments)    Feeling tired   Meloxicam    Morphine And Related Nausea And Vomiting    Nausea and stomach pain    Promethazine Other (See Comments)    Can't wake up   Sulfamethoxazole-Trimethoprim Diarrhea    Leads to c-diff   Other Rash    Uncoded Allergy. Allergen: CHLORINE   Tylenol With Codeine #3 [Acetaminophen-Codeine] Itching and Anxiety    Medications Prior to Admission  Medication Sig Dispense Refill   Cholecalciferol (VITAMIN D) 50 MCG (2000 UT) CAPS Take 2,000 Units by mouth in the morning and at bedtime.     Cranberry-Vitamin C 250-60 MG CAPS Take 2 tablets by mouth daily.     estradiol (ESTRACE) 0.1 MG/GM vaginal cream Place 1 Applicatorful vaginally 2 (two) times a week.     ezetimibe (ZETIA) 10 MG tablet Take 10 mg by mouth daily.      famotidine (PEPCID) 20 MG tablet Take 20 mg by mouth at bedtime.     GLUCOSAMINE-CHONDROITIN PO Take 2 capsules by mouth daily.  Lactobacillus (AZO COMPLETE FEMININE BALANCE PO) Take 1 tablet by mouth daily.     levothyroxine (SYNTHROID, LEVOTHROID) 50 MCG tablet Take 50 mcg by mouth daily before breakfast.     mometasone (NASONEX) 50 MCG/ACT nasal spray Place 2 sprays into the nose daily as needed (for allergies).      pregabalin (LYRICA) 50 MG capsule Take 50 mg by mouth every evening.     Probiotic Product (ALIGN) 4 MG CAPS Take 4 mg by mouth daily.     sertraline (ZOLOFT) 100 MG tablet Take 100 mg by mouth daily.     TURMERIC PO Take 2 capsules by mouth daily.     vitamin E 400 UNIT capsule Take 400 Units by mouth daily.     aspirin EC 325 MG EC tablet Take 1 tablet (325 mg total) by mouth 2 (two) times daily. (Patient not taking: No sig reported) 30 tablet 0   HYDROcodone-acetaminophen (NORCO/VICODIN) 5-325 MG tablet Take 1-2  tablets by mouth every 6 (six) hours as needed for moderate pain. (Patient not taking: No sig reported) 50 tablet 0   nitrofurantoin (MACRODANTIN) 50 MG capsule Take 50 mg by mouth at bedtime. For bladder  (Patient not taking: No sig reported)     tiZANidine (ZANAFLEX) 2 MG tablet Take 1 tablet (2 mg total) by mouth every 6 (six) hours as needed. (Patient not taking: No sig reported) 50 tablet 0    No results found for this or any previous visit (from the past 48 hour(s)). No results found.  Review of Systems  All other systems reviewed and are negative.  Blood pressure 121/76, pulse 82, temperature 97.6 F (36.4 C), resp. rate 17, height 5\' 4"  (1.626 m), weight 68 kg, SpO2 98 %. Physical Exam HENT:     Head: Atraumatic.  Eyes:     Extraocular Movements: Extraocular movements intact.  Cardiovascular:     Pulses: Normal pulses.  Pulmonary:     Effort: Pulmonary effort is normal.  Musculoskeletal:     Comments: L shoulder pain with limited ROM> NVID  Neurological:     Mental Status: She is alert.  Psychiatric:        Mood and Affect: Mood normal.     Assessment/Plan L shoulder endstage arthritis with advanced rotator cuff disease Plan L reverse TSA Risks / benefits of surgery discussed Consent on chart  NPO for OR Preop antibiotics   Isabella Stalling, MD 08/23/2020, 7:16 AM

## 2020-08-23 NOTE — Anesthesia Postprocedure Evaluation (Signed)
Anesthesia Post Note  Patient: [name]  Procedure(s) Performed: REVERSE SHOULDER ARTHROPLASTY (Left: Shoulder)     Patient location during evaluation: PACU Anesthesia Type: Regional and General Level of consciousness: awake and alert Pain management: pain level controlled Vital Signs Assessment: post-procedure vital signs reviewed and stable Respiratory status: spontaneous breathing, nonlabored ventilation, respiratory function stable and patient connected to nasal cannula oxygen Cardiovascular status: blood pressure returned to baseline and stable Postop Assessment: no apparent nausea or vomiting Anesthetic complications: no   No notable events documented.  Last Vitals:  Vitals:   08/23/20 1203 08/23/20 1355  BP: 108/67 103/62  Pulse: 75 72  Resp: 18 16  Temp: 36.9 C 36.8 C  SpO2: 99% 100%    Last Pain:  Vitals:   08/23/20 1355  TempSrc: Oral  PainSc:                  Marie Maynard

## 2020-08-23 NOTE — Anesthesia Procedure Notes (Signed)
Procedure Name: Intubation Date/Time: 08/23/2020 7:36 AM Performed by: West Pugh, CRNA Pre-anesthesia Checklist: Patient identified, Emergency Drugs available, Suction available, Patient being monitored and Timeout performed Patient Re-evaluated:Patient Re-evaluated prior to induction Oxygen Delivery Method: Circle system utilized Preoxygenation: Pre-oxygenation with 100% oxygen Induction Type: IV induction Ventilation: Mask ventilation without difficulty Laryngoscope Size: Mac and 3 Grade View: Grade II Tube type: Oral Tube size: 7.0 mm Number of attempts: 1 Airway Equipment and Method: Stylet Placement Confirmation: ETT inserted through vocal cords under direct vision, positive ETCO2 and breath sounds checked- equal and bilateral Secured at: 22 cm Tube secured with: Tape Dental Injury: Teeth and Oropharynx as per pre-operative assessment

## 2020-08-24 ENCOUNTER — Encounter (HOSPITAL_COMMUNITY): Payer: Self-pay | Admitting: Orthopedic Surgery

## 2020-08-24 DIAGNOSIS — M19012 Primary osteoarthritis, left shoulder: Secondary | ICD-10-CM | POA: Diagnosis not present

## 2020-08-24 MED ORDER — OXYCODONE HCL 5 MG PO TABS: 5.0000 mg | ORAL_TABLET | Freq: Four times a day (QID) | ORAL | 0 refills | Status: AC | PRN

## 2020-08-24 MED ORDER — TIZANIDINE HCL 2 MG PO TABS: 2.0000 mg | ORAL_TABLET | Freq: Three times a day (TID) | ORAL | 0 refills | Status: AC | PRN

## 2020-08-24 MED ORDER — ASPIRIN 325 MG PO TBEC: 325.0000 mg | DELAYED_RELEASE_TABLET | Freq: Every day | ORAL | 0 refills | Status: AC

## 2020-08-24 MED ORDER — ONDANSETRON HCL 4 MG PO TABS: 4.0000 mg | ORAL_TABLET | Freq: Four times a day (QID) | ORAL | 0 refills | Status: AC | PRN

## 2020-08-24 MED ORDER — ASPIRIN EC 325 MG PO TBEC
325.0000 mg | DELAYED_RELEASE_TABLET | Freq: Every day | ORAL | Status: DC
  Administered 2020-08-24: 325 mg via ORAL
  Filled 2020-08-24: qty 1

## 2020-08-24 NOTE — Discharge Summary (Addendum)
Physician Discharge Summary  Patient ID: Marie Maynard MRN: [id number] DOB/AGE: [birth year] 70 y.o.  Admit date: 08/23/2020 Discharge date: 08/24/2020  Admission Diagnoses: End-stage left shoulder osteoarthritis and rotator cuff disease  Discharge Diagnoses:  Active Problems:   S/P reverse total shoulder arthroplasty, left   Discharged Condition: stable  Hospital Course: Patient presented to OR for left total reverse shoulder arthroplasty. Surgery was completed without complication. Patient transferred to PACU and subsequently to the orthopedic floor. OT consulted with the patient on sling management and ADLs,  Consults: None  Significant Diagnostic Studies: radiology: X-Ray: Left shoulder show the prosthesis in excellent position with no periprosthetic abnormalities.   Treatments: surgery: left reverse total shoulder arthroplasty  Discharge Exam: Blood pressure 106/69, pulse 92, temperature 98.9 F (37.2 C), temperature source Oral, resp. rate 17, height 5\' 4"  (1.626 m), weight 68 kg, SpO2 96 %. General appearance: alert, cooperative, and no distress Resp: no labored breathing Extremities: no edema, redness or tenderness. Left shoulder in sling. Able to move fingers, hand, and wrist of left UE. Pulses: 2+ and symmetric Skin: Skin color, texture, turgor normal. No rashes or lesions  Neurologic: Alert and oriented X 3, normal strength and tone.  Incision/Wound: Dressing C/D/I  Disposition: Discharge disposition: 01-Home or Self Care       Discharge Instructions     Call MD / Call 911   Complete by: As directed    If you experience chest pain or shortness of breath, CALL 911 and be transported to the hospital emergency room.  If you develope a fever above 101 F, pus (white drainage) or increased drainage or redness at the wound, or calf pain, call your surgeon's office.   Constipation Prevention   Complete by: As directed    Drink plenty of fluids.  Prune juice  may be helpful.  You may use a stool softener, such as Colace (over the counter) 100 mg twice a day.  Use MiraLax (over the counter) for constipation as needed.   Diet - low sodium heart healthy   Complete by: As directed    Discharge instructions   Complete by: As directed    Discharge Instructions after Reverse Total Shoulder Arthroplasty   A sling has been provided for you. You are to wear this at all times (except for bathing and dressing), until your first post operative visit with Dr. Tamera Punt. Please also wear while sleeping at night. While you bath and dress, let the arm/elbow extend straight down to stretch your elbow. Wiggle your fingers and pump your first while your in the sling to prevent hand swelling. Use ice on the shoulder intermittently over the first 48 hours after surgery. Continue to use ice or and ice machine as needed after 48 hours for pain control/swelling.  Pain medicine has been prescribed for you.  Use your medicine liberally over the first 48 hours, and then you can begin to taper your use. You may take Extra Strength Tylenol or Tylenol only in place of the pain pills. DO NOT take ANY nonsteroidal anti-inflammatory pain medications: Advil, Motrin, Ibuprofen, Aleve, Naproxen or Naprosyn.  Take one aspirin a day for 2 weeks after surgery, unless you have an aspirin sensitivity/allergy or asthma.  Leave your dressing on until your first follow up visit.  You may shower with the dressing.  Hold your arm as if you still have your sling on while you shower. Simply allow the water to wash over the site and then pat dry. Make sure your  axilla (armpit) is completely dry after showering.    Please call [phone] during normal business hours or [phone] after hours for any problems. Including the following:  - excessive redness of the incisions - drainage for more than 4 days - fever of more than 101.5 F  *Please note that pain medications will not be refilled after  hours or on weekends.  Dental Antibiotics:  In most cases prophylactic antibiotics for Dental procdeures after total joint surgery are not necessary.  Exceptions are as follows:  1. History of prior total joint infection  2. Severely immunocompromised (Organ Transplant, cancer chemotherapy, Rheumatoid biologic meds such as Bennett)  3. Poorly controlled diabetes (A1C &gt; 8.0, blood glucose over 200)  If you have one of these conditions, contact your surgeon for an antibiotic prescription, prior to your dental procedure.   Increase activity slowly as tolerated   Complete by: As directed    Post-operative opioid taper instructions:   Complete by: As directed    POST-OPERATIVE OPIOID TAPER INSTRUCTIONS: It is important to wean off of your opioid medication as soon as possible. If you do not need pain medication after your surgery it is ok to stop day one. Opioids include: Codeine, Hydrocodone(Norco, Vicodin), Oxycodone(Percocet, oxycontin) and hydromorphone amongst others.  Long term and even short term use of opiods can cause: Increased pain response Dependence Constipation Depression Respiratory depression And more.  Withdrawal symptoms can include Flu like symptoms Nausea, vomiting And more Techniques to manage these symptoms Hydrate well Eat regular healthy meals Stay active Use relaxation techniques(deep breathing, meditating, yoga) Do Not substitute Alcohol to help with tapering If you have been on opioids for less than two weeks and do not have pain than it is ok to stop all together.  Plan to wean off of opioids This plan should start within one week post op of your joint replacement. Maintain the same interval or time between taking each dose and first decrease the dose.  Cut the total daily intake of opioids by one tablet each day Next start to increase the time between doses. The last dose that should be eliminated is the evening dose.         Allergies  as of 08/24/2020       Reactions   Codeine Itching   Dilaudid [hydromorphone Hcl] Itching, Nausea And Vomiting, Anxiety   Neurontin [gabapentin] Other (See Comments)   Seeing double   Oxycodone Itching   Prednisone Anxiety   Celebrex [celecoxib] Nausea Only   Nausea and stomach pain    Ciprofloxacin Diarrhea   Leads to c-diff   Levaquin [levofloxacin] Itching, Other (See Comments)   Feeling tired   Meloxicam    Morphine And Related Nausea And Vomiting   Nausea and stomach pain    Promethazine Other (See Comments)   Can't wake up   Sulfamethoxazole-trimethoprim Diarrhea   Leads to c-diff   Other Rash   Uncoded Allergy. Allergen: CHLORINE   Tylenol With Codeine #3 [acetaminophen-codeine] Itching, Anxiety        Medication List     STOP taking these medications    HYDROcodone-acetaminophen 5-325 MG tablet Commonly known as: NORCO/VICODIN   nitrofurantoin 50 MG capsule Commonly known as: MACRODANTIN       TAKE these medications    Align 4 MG Caps Take 4 mg by mouth daily.   aspirin 325 MG EC tablet Take 1 tablet (325 mg total) by mouth daily. What changed: when to take this   AZO COMPLETE  FEMININE BALANCE PO Take 1 tablet by mouth daily.   Cranberry-Vitamin C 250-60 MG Caps Take 2 tablets by mouth daily.   estradiol 0.1 MG/GM vaginal cream Commonly known as: ESTRACE Place 1 Applicatorful vaginally 2 (two) times a week.   ezetimibe 10 MG tablet Commonly known as: ZETIA Take 10 mg by mouth daily.   famotidine 20 MG tablet Commonly known as: PEPCID Take 20 mg by mouth at bedtime.   GLUCOSAMINE-CHONDROITIN PO Take 2 capsules by mouth daily.   levothyroxine 50 MCG tablet Commonly known as: SYNTHROID Take 50 mcg by mouth daily before breakfast.   mometasone 50 MCG/ACT nasal spray Commonly known as: NASONEX Place 2 sprays into the nose daily as needed (for allergies).   ondansetron 4 MG tablet Commonly known as: ZOFRAN Take 1 tablet (4 mg total)  by mouth every 6 (six) hours as needed for nausea.   oxyCODONE 5 MG immediate release tablet Commonly known as: Oxy IR/ROXICODONE Take 1-2 tablets (5-10 mg total) by mouth every 6 (six) hours as needed for moderate pain (pain score 4-6).   pregabalin 50 MG capsule Commonly known as: LYRICA Take 50 mg by mouth every evening.   sertraline 100 MG tablet Commonly known as: ZOLOFT Take 100 mg by mouth daily.   tiZANidine 2 MG tablet Commonly known as: ZANAFLEX Take 1 tablet (2 mg total) by mouth every 8 (eight) hours as needed (muscle pain). What changed:  when to take this reasons to take this   TURMERIC PO Take 2 capsules by mouth daily.   Vitamin D 50 MCG (2000 UT) Caps Take 2,000 Units by mouth in the morning and at bedtime.   vitamin E 180 MG (400 UNITS) capsule Take 400 Units by mouth daily.        Follow-up Information     Tania Ade, MD. Schedule an appointment as soon as possible for a visit in 2 week(s).   Specialty: Orthopedic Surgery Contact information: [address] [place] [address] [phone]                 Signed: Luetta Nutting L. [place] 08/24/2020, 7:51 AM

## 2020-08-24 NOTE — Evaluation (Signed)
Occupational Therapy Evaluation Patient Details Name: Marie Maynard MRN: [id number] DOB: 08/02/1950 Today's Date: 08/24/2020    History of Present Illness s/p L rTSA   Clinical Impression   Marie Maynard is a 70 year old woman s/p shoulder replacement without functional use of left non-dominant upper extremity secondary to effects of surgery and interscalene block and shoulder precautions. Therapist provided education and instruction to patient in regards to exercises, precautions, positioning, donning upper extremity clothing and bathing while maintaining shoulder precautions, ice and edema management with use of cuff and cooler and donning/doffing sling. Patient verbalized understanding and demonstrated as needed. Provided with handouts to maximize retention of education. Patient needed assistance to donn shirt, underwear, and pants. Will have assistance of husband at home. Patient to follow up with MD for further therapy needs.      Follow Up Recommendations  No OT follow up;Follow surgeon's recommendation for DC plan and follow-up therapies    Equipment Recommendations  None recommended by OT    Recommendations for Other Services       Precautions / Restrictions Precautions Precautions: Shoulder Type of Shoulder Precautions: No AROM/PROM Shoulder Interventions: Shoulder sling/immobilizer;Off for dressing/bathing/exercises;At all times Precaution Booklet Issued:  (handouts) Required Braces or Orthoses: Sling Restrictions Weight Bearing Restrictions: Yes LUE Weight Bearing: Non weight bearing      Mobility Bed Mobility               General bed mobility comments: OOB    Transfers Overall transfer level: Modified independent Equipment used: None                  Balance Overall balance assessment: Mild deficits observed, not formally tested                                         ADL either performed or assessed with  clinical judgement   ADL Overall ADL's : Needs assistance/impaired Eating/Feeding: Independent   Grooming: Modified independent   Upper Body Bathing: Moderate assistance;Sitting   Lower Body Bathing: Sit to/from stand;Modified independent   Upper Body Dressing : Moderate assistance;Sitting   Lower Body Dressing: Minimal assistance;Sit to/from stand   Toilet Transfer: Supervision/safety   Toileting- Water quality scientist and Hygiene: Minimal assistance               Vision Patient Visual Report: No change from baseline       Perception     Praxis      Pertinent Vitals/Pain Pain Assessment: No/denies pain     Hand Dominance     Extremity/Trunk Assessment Upper Extremity Assessment Upper Extremity Assessment: LUE deficits/detail LUE: Unable to fully assess due to immobilization           Communication     Cognition Arousal/Alertness: Awake/alert Behavior During Therapy: WFL for tasks assessed/performed Overall Cognitive Status: Within Functional Limits for tasks assessed                                     General Comments       Exercises     Shoulder Instructions Shoulder Instructions Donning/doffing shirt without moving shoulder: Patient able to independently direct caregiver Method for sponge bathing under operated UE: Patient able to independently direct caregiver Donning/doffing sling/immobilizer: Patient able to independently direct caregiver Correct positioning of sling/immobilizer: Independent ROM  for elbow, wrist and digits of operated UE: Independent Sling wearing schedule (on at all times/off for ADL's): Independent Proper positioning of operated UE when showering: Independent Dressing change: Independent Positioning of UE while sleeping: [place] expects to be discharged to:: Private residence Living Arrangements: Spouse/significant other                                       Prior Functioning/Environment                   OT Problem List: Decreased strength;Decreased range of motion;Impaired UE functional use;Pain      OT Treatment/Interventions:      OT Goals(Current goals can be found in the care plan section) Acute Rehab OT Goals OT Goal Formulation: All assessment and education complete, DC therapy  OT Frequency:     Barriers to D/C:            Co-evaluation              AM-PAC OT "6 Clicks" Daily Activity     Outcome Measure Help from another person eating meals?: None Help from another person taking care of personal grooming?: None Help from another person toileting, which includes using toliet, bedpan, or urinal?: A Little Help from another person bathing (including washing, rinsing, drying)?: A Little Help from another person to put on and taking off regular upper body clothing?: A Lot Help from another person to put on and taking off regular lower body clothing?: A Little 6 Click Score: 19   End of Session Nurse Communication: Mobility status  Activity Tolerance: Patient tolerated treatment well Patient left: in chair  OT Visit Diagnosis: Pain                Time: 4166-0630 OT Time Calculation (min): 35 min Charges:  OT General Charges $OT Visit: 1 Visit OT Evaluation $OT Eval Low Complexity: 1 Low OT Treatments $Self Care/Home Management : 8-22 mins  Ka Flammer, OTR/L Acute Care Rehab Services  Office [phone] Pager: [phone]   Lenward Chancellor 08/24/2020, 9:02 AM

## 2020-08-24 NOTE — Progress Notes (Signed)
PATIENT ID: Marie Maynard  MRN: [id number]  DOB/AGE:  [birth year] / 70 y.o.  1 Day Post-Op Procedure(s) (LRB): REVERSE SHOULDER ARTHROPLASTY (Left)  Subjective: Patient feeling well after surgery but did not sleep well last night. Pain is mild, reporting that the block wore off in the middle of the night.  No c/o chest pain or SOB.  Voiding well. Positive flatus.   Objective: Vital signs in last 24 hours: Temp:  [98 F (36.7 C)-98.9 F (37.2 C)] 98.9 F (37.2 C) (06/10 0514) Pulse Rate:  [72-92] 92 (06/10 0514) Resp:  [14-20] 17 (06/10 0514) BP: (91-142)/(57-83) 106/69 (06/10 0514) SpO2:  [95 %-100 %] 96 % (06/10 0514) Weight:  [68 kg] 68 kg (06/09 1203)  Intake/Output from previous day: 06/09 0701 - 06/10 0700 In: 4380.1 [P.O.:680; I.V.:3500.1; IV Piggyback:200] Out: 1700 [Urine:1500; Blood:200]   Physical Exam: Alert and oriented. No acute distress.  Dressing C/D/I Able to flex and extend left hand fingers, wrist Distal pulses 2+ Sensation to light touch intact  Assessment/Plan: 1 Day Post-Op Procedure(s) (LRB): REVERSE SHOULDER ARTHROPLASTY (Left)   Advance diet Up with therapy- OT to meet with patient this morning Non Weight Bearing (NWB) left UE  VTE prophylaxis:  ASA  Patient doing well. Will remain in sling except with bathing. She can flex and extend at the elbow when out of the sling when bathing. Encouraged patient to move fingers.  Plan for DC home today Follow up in office in 2 weeks   Sung [place] L. [place] 08/24/2020, 7:35 AM

## 2021-10-10 ENCOUNTER — Ambulatory Visit: Payer: Medicare PPO

## 2021-10-10 ENCOUNTER — Encounter: Payer: Self-pay | Admitting: Podiatry

## 2021-10-10 ENCOUNTER — Ambulatory Visit: Payer: Medicare PPO | Admitting: Podiatry

## 2021-10-10 DIAGNOSIS — M7751 Other enthesopathy of right foot: Secondary | ICD-10-CM | POA: Diagnosis not present

## 2021-10-10 DIAGNOSIS — D2371 Other benign neoplasm of skin of right lower limb, including hip: Secondary | ICD-10-CM

## 2021-10-10 DIAGNOSIS — M778 Other enthesopathies, not elsewhere classified: Secondary | ICD-10-CM

## 2021-10-10 MED ORDER — DEXAMETHASONE SODIUM PHOSPHATE 120 MG/30ML IJ SOLN
2.0000 mg | Freq: Once | INTRAMUSCULAR | Status: AC
  Administered 2021-10-10: 2 mg via INTRA_ARTICULAR

## 2021-10-10 NOTE — Progress Notes (Signed)
She presents today chief complaint of painful lesion beneath the fifth metatarsal of the right foot she states a small callused area is been there for years she saw Dr. March Rummage for it before states that it is redeveloped and is now tender and painful to walk on.  Objective: Vital signs are stable alert and oriented x3 there is no erythema edema cellulitis drainage or odor there is fluctuance beneath the lesion some fifth metatarsal head of the right foot.  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  Plan: I debrided the reactive hyperkeratotic lesion and injected the bursa with 2 mg of dexamethasone follow-up with me on an as-needed basis
